# Patient Record
Sex: Female | Born: 1960 | Race: Black or African American | Hispanic: No | Smoking: Never smoker
Health system: Southern US, Community
[De-identification: ages and names within clinical notes are randomized; demographics above are authoritative.]

## PROBLEM LIST (undated history)

## (undated) DIAGNOSIS — E119 Type 2 diabetes mellitus without complications: Secondary | ICD-10-CM

## (undated) DIAGNOSIS — I10 Essential (primary) hypertension: Secondary | ICD-10-CM

## (undated) DIAGNOSIS — I639 Cerebral infarction, unspecified: Secondary | ICD-10-CM

## (undated) DIAGNOSIS — R079 Chest pain, unspecified: Secondary | ICD-10-CM

## (undated) DIAGNOSIS — O2230 Deep phlebothrombosis in pregnancy, unspecified trimester: Secondary | ICD-10-CM

## (undated) DIAGNOSIS — O009 Unspecified ectopic pregnancy without intrauterine pregnancy: Secondary | ICD-10-CM

## (undated) HISTORY — PX: KNEE ARTHROSCOPY: SUR90

## (undated) HISTORY — PX: ABDOMINAL HYSTERECTOMY: SHX81

---

## 2006-10-24 ENCOUNTER — Emergency Department: Payer: Self-pay | Admitting: General Practice

## 2007-04-04 ENCOUNTER — Emergency Department: Payer: Self-pay | Admitting: Emergency Medicine

## 2008-09-22 ENCOUNTER — Emergency Department: Payer: Self-pay | Admitting: Emergency Medicine

## 2009-02-01 ENCOUNTER — Ambulatory Visit: Payer: Self-pay | Admitting: Internal Medicine

## 2009-05-24 ENCOUNTER — Ambulatory Visit: Payer: Self-pay | Admitting: Internal Medicine

## 2010-01-29 ENCOUNTER — Emergency Department: Payer: Self-pay | Admitting: Emergency Medicine

## 2011-05-08 ENCOUNTER — Emergency Department: Payer: Self-pay | Admitting: Emergency Medicine

## 2011-11-24 ENCOUNTER — Emergency Department: Payer: Self-pay | Admitting: Internal Medicine

## 2013-04-22 ENCOUNTER — Emergency Department: Payer: Self-pay | Admitting: Emergency Medicine

## 2013-04-22 LAB — COMPREHENSIVE METABOLIC PANEL
Albumin: 4.1 g/dL (ref 3.4–5.0)
Anion Gap: 11 (ref 7–16)
BUN: 23 mg/dL — ABNORMAL HIGH (ref 7–18)
Bilirubin,Total: 0.3 mg/dL (ref 0.2–1.0)
Calcium, Total: 8.9 mg/dL (ref 8.5–10.1)
Chloride: 104 mmol/L (ref 98–107)
EGFR (African American): >60
Glucose: 129 mg/dL — ABNORMAL HIGH (ref 65–99)
Osmolality: 281 (ref 275–301)
SGOT(AST): 26 U/L (ref 15–37)
Sodium: 138 mmol/L (ref 136–145)

## 2013-04-22 LAB — URINALYSIS, COMPLETE
Blood: NEGATIVE
Ketone: NEGATIVE
Nitrite: NEGATIVE
Ph: 5 (ref 4.5–8.0)
RBC,UR: 1 /HPF (ref 0–5)
Specific Gravity: 1.02 (ref 1.003–1.030)
Squamous Epithelial: 10
WBC UR: 1 /HPF (ref 0–5)

## 2013-04-22 LAB — CBC
HCT: 35.1 % (ref 35.0–47.0)
HGB: 12.1 g/dL (ref 12.0–16.0)
MCHC: 34.4 g/dL (ref 32.0–36.0)
Platelet: 259 10*3/uL (ref 150–440)
RDW: 13.4 % (ref 11.5–14.5)

## 2013-04-22 LAB — TROPONIN I: Troponin-I: <0.02 ng/mL

## 2013-05-15 ENCOUNTER — Ambulatory Visit: Payer: Self-pay | Admitting: Unknown Physician Specialty

## 2013-08-21 ENCOUNTER — Emergency Department: Payer: Self-pay | Admitting: Emergency Medicine

## 2013-08-21 LAB — CBC WITH DIFFERENTIAL/PLATELET
Basophil #: 0.1 10*3/uL (ref 0.0–0.1)
Eosinophil #: 0.2 10*3/uL (ref 0.0–0.7)
HCT: 34 % — ABNORMAL LOW (ref 35.0–47.0)
HGB: 11.6 g/dL — ABNORMAL LOW (ref 12.0–16.0)
Lymphocyte #: 2.1 10*3/uL (ref 1.0–3.6)
Lymphocyte %: 36.7 %
MCH: 27.8 pg (ref 26.0–34.0)
MCV: 81 fL (ref 80–100)
Monocyte #: 0.3 x10 3/mm (ref 0.2–0.9)
Monocyte %: 5.5 %
Neutrophil %: 53.1 %
Platelet: 280 10*3/uL (ref 150–440)
RBC: 4.19 10*6/uL (ref 3.80–5.20)
RDW: 13.7 % (ref 11.5–14.5)

## 2013-08-21 LAB — COMPREHENSIVE METABOLIC PANEL
Alkaline Phosphatase: 148 U/L — ABNORMAL HIGH (ref 50–136)
Anion Gap: 7 (ref 7–16)
BUN: 12 mg/dL (ref 7–18)
Bilirubin,Total: 0.3 mg/dL (ref 0.2–1.0)
Calcium, Total: 9.8 mg/dL (ref 8.5–10.1)
Chloride: 104 mmol/L (ref 98–107)
Co2: 28 mmol/L (ref 21–32)
EGFR (Non-African Amer.): >60
Glucose: 182 mg/dL — ABNORMAL HIGH (ref 65–99)
Potassium: 3.5 mmol/L (ref 3.5–5.1)
SGOT(AST): 24 U/L (ref 15–37)
Total Protein: 8.1 g/dL (ref 6.4–8.2)

## 2013-09-27 ENCOUNTER — Emergency Department: Payer: Self-pay | Admitting: Emergency Medicine

## 2013-09-27 LAB — CBC WITH DIFFERENTIAL/PLATELET
Basophil %: 1.2 %
Eosinophil #: 0.2 10*3/uL (ref 0.0–0.7)
Lymphocyte #: 2.7 10*3/uL (ref 1.0–3.6)
Lymphocyte %: 44.3 %
MCV: 80 fL (ref 80–100)
Monocyte #: 0.5 x10 3/mm (ref 0.2–0.9)
Monocyte %: 8.5 %
Neutrophil #: 2.6 10*3/uL (ref 1.4–6.5)
Platelet: 284 10*3/uL (ref 150–440)
RBC: 4.49 10*6/uL (ref 3.80–5.20)
WBC: 6 10*3/uL (ref 3.6–11.0)

## 2013-09-27 LAB — COMPREHENSIVE METABOLIC PANEL
Albumin: 4.3 g/dL (ref 3.4–5.0)
Alkaline Phosphatase: 158 U/L — ABNORMAL HIGH (ref 50–136)
Anion Gap: 7 (ref 7–16)
Calcium, Total: 10 mg/dL (ref 8.5–10.1)
Chloride: 104 mmol/L (ref 98–107)
Co2: 26 mmol/L (ref 21–32)
Creatinine: 0.68 mg/dL (ref 0.60–1.30)
EGFR (African American): >60
Glucose: 125 mg/dL — ABNORMAL HIGH (ref 65–99)
Potassium: 3.4 mmol/L — ABNORMAL LOW (ref 3.5–5.1)
SGOT(AST): 24 U/L (ref 15–37)
SGPT (ALT): 25 U/L (ref 12–78)
Total Protein: 8.7 g/dL — ABNORMAL HIGH (ref 6.4–8.2)

## 2013-09-27 LAB — URINALYSIS, COMPLETE
Ketone: NEGATIVE
Nitrite: NEGATIVE
Protein: NEGATIVE
Specific Gravity: 1.011 (ref 1.003–1.030)
Squamous Epithelial: 2
WBC UR: NONE SEEN /HPF (ref 0–5)

## 2013-09-27 LAB — APTT: Activated PTT: 32 secs (ref 23.6–35.9)

## 2013-09-27 LAB — RAPID URINE DRUG SCREEN, HOSP PERFORMED
Amphetamines, Ur Screen: NEGATIVE (ref ?–1000)
Barbiturates, Ur Screen: NEGATIVE (ref ?–200)
Benzodiazepine, Ur Scrn: NEGATIVE (ref ?–200)
Cocaine Metabolite,Ur ~~LOC~~: NEGATIVE (ref ?–300)

## 2013-09-27 LAB — PROTIME-INR: Prothrombin Time: 12.5 secs (ref 11.5–14.7)

## 2013-10-04 ENCOUNTER — Emergency Department: Payer: Self-pay | Admitting: Emergency Medicine

## 2013-10-04 LAB — CBC
HGB: 13 g/dL (ref 12.0–16.0)
Platelet: 298 10*3/uL (ref 150–440)
RBC: 4.78 10*6/uL (ref 3.80–5.20)
RDW: 13.4 % (ref 11.5–14.5)
WBC: 14.4 10*3/uL — ABNORMAL HIGH (ref 3.6–11.0)

## 2013-10-04 LAB — LIPASE, BLOOD: Lipase: 121 U/L (ref 73–393)

## 2013-10-04 LAB — COMPREHENSIVE METABOLIC PANEL
Alkaline Phosphatase: 170 U/L — ABNORMAL HIGH (ref 50–136)
Anion Gap: 5 — ABNORMAL LOW (ref 7–16)
BUN: 21 mg/dL — ABNORMAL HIGH (ref 7–18)
Calcium, Total: 10.2 mg/dL — ABNORMAL HIGH (ref 8.5–10.1)
Chloride: 107 mmol/L (ref 98–107)
Co2: 26 mmol/L (ref 21–32)
Creatinine: 0.91 mg/dL (ref 0.60–1.30)
EGFR (Non-African Amer.): >60
Potassium: 3.8 mmol/L (ref 3.5–5.1)
SGOT(AST): 50 U/L — ABNORMAL HIGH (ref 15–37)
SGPT (ALT): 64 U/L (ref 12–78)

## 2013-10-04 LAB — TROPONIN I: Troponin-I: <0.02 ng/mL

## 2015-02-11 ENCOUNTER — Ambulatory Visit: Payer: Self-pay

## 2016-05-06 ENCOUNTER — Ambulatory Visit
Admission: RE | Admit: 2016-05-06 | Discharge: 2016-05-06 | Disposition: A | Discharge status: Home / Self Care | Payer: Self-pay | Source: Ambulatory Visit | Attending: Oncology | Admitting: Oncology

## 2016-05-06 ENCOUNTER — Ambulatory Visit: Payer: Self-pay | Attending: Oncology

## 2016-05-06 ENCOUNTER — Other Ambulatory Visit: Payer: Self-pay | Admitting: Oncology

## 2016-05-06 VITALS — BP 128/70 | HR 81 | Temp 98.1°F | Resp 20 | Ht 64.17 in | Wt 155.9 lb

## 2016-05-06 DIAGNOSIS — Z0000 Encounter for general adult medical examination without abnormal findings: Secondary | ICD-10-CM

## 2016-05-06 NOTE — Progress Notes (Signed)
Subjective:     Patient ID: Mary Cuevas, female   DOB: 1961-11-20, 55 y.o.   MRN: [id number]  HPI   Review of Systems     Objective:   Physical Exam  Pulmonary/Chest: Right breast exhibits no inverted nipple, no mass, no nipple discharge, no skin change and no tenderness. Left breast exhibits no inverted nipple, no mass, no nipple discharge, no skin change and no tenderness. Breasts are symmetrical.       Assessment:     55 year old patient presents for B[facility]CP clinic visit.  Patient screened, and meets BCCCP eligibility.  Patient does not have insurance, Medicare or Medicaid.  Handout given on Affordable Care Act.  Instructed patient on breast self-exam using teach back method.  CBE unremarkable.       Plan:  Sent for bilateral screening mammogram.

## 2016-05-13 NOTE — Progress Notes (Signed)
Letter mailed from [facility] to notify of normal mammogram results.  Patient to return in one year for annual screening.  Copy to [facility]. 

## 2016-10-25 ENCOUNTER — Observation Stay
Admission: EM | Admit: 2016-10-25 | Discharge: 2016-10-26 | Disposition: A | Discharge status: Home / Self Care | Payer: Medicaid Other | Attending: Internal Medicine | Admitting: Internal Medicine

## 2016-10-25 ENCOUNTER — Encounter: Payer: Self-pay | Admitting: Urgent Care

## 2016-10-25 ENCOUNTER — Emergency Department: Payer: Medicaid Other

## 2016-10-25 ENCOUNTER — Observation Stay (HOSPITAL_BASED_OUTPATIENT_CLINIC_OR_DEPARTMENT_OTHER)
Admit: 2016-10-25 | Discharge: 2016-10-25 | Disposition: A | Discharge status: Home / Self Care | Payer: Medicaid Other | Attending: Internal Medicine | Admitting: Internal Medicine

## 2016-10-25 DIAGNOSIS — Z86718 Personal history of other venous thrombosis and embolism: Secondary | ICD-10-CM | POA: Insufficient documentation

## 2016-10-25 DIAGNOSIS — I10 Essential (primary) hypertension: Secondary | ICD-10-CM

## 2016-10-25 DIAGNOSIS — Z7984 Long term (current) use of oral hypoglycemic drugs: Secondary | ICD-10-CM | POA: Insufficient documentation

## 2016-10-25 DIAGNOSIS — E784 Other hyperlipidemia: Secondary | ICD-10-CM

## 2016-10-25 DIAGNOSIS — R0600 Dyspnea, unspecified: Secondary | ICD-10-CM | POA: Diagnosis not present

## 2016-10-25 DIAGNOSIS — Z885 Allergy status to narcotic agent status: Secondary | ICD-10-CM | POA: Insufficient documentation

## 2016-10-25 DIAGNOSIS — E785 Hyperlipidemia, unspecified: Secondary | ICD-10-CM | POA: Insufficient documentation

## 2016-10-25 DIAGNOSIS — I200 Unstable angina: Secondary | ICD-10-CM | POA: Diagnosis present

## 2016-10-25 DIAGNOSIS — Z90710 Acquired absence of both cervix and uterus: Secondary | ICD-10-CM | POA: Diagnosis not present

## 2016-10-25 DIAGNOSIS — Z7982 Long term (current) use of aspirin: Secondary | ICD-10-CM | POA: Insufficient documentation

## 2016-10-25 DIAGNOSIS — R079 Chest pain, unspecified: Secondary | ICD-10-CM

## 2016-10-25 DIAGNOSIS — E119 Type 2 diabetes mellitus without complications: Secondary | ICD-10-CM | POA: Insufficient documentation

## 2016-10-25 DIAGNOSIS — I071 Rheumatic tricuspid insufficiency: Secondary | ICD-10-CM | POA: Insufficient documentation

## 2016-10-25 DIAGNOSIS — Z8673 Personal history of transient ischemic attack (TIA), and cerebral infarction without residual deficits: Secondary | ICD-10-CM | POA: Diagnosis not present

## 2016-10-25 DIAGNOSIS — Z803 Family history of malignant neoplasm of breast: Secondary | ICD-10-CM | POA: Insufficient documentation

## 2016-10-25 DIAGNOSIS — E7849 Other hyperlipidemia: Secondary | ICD-10-CM

## 2016-10-25 HISTORY — DX: Cerebral infarction, unspecified: I63.9

## 2016-10-25 HISTORY — DX: Chest pain, unspecified: R07.9

## 2016-10-25 HISTORY — DX: Type 2 diabetes mellitus without complications: E11.9

## 2016-10-25 HISTORY — DX: Deep phlebothrombosis in pregnancy, unspecified trimester: O22.30

## 2016-10-25 HISTORY — DX: Essential (primary) hypertension: I10

## 2016-10-25 HISTORY — DX: Unspecified ectopic pregnancy without intrauterine pregnancy: O00.90

## 2016-10-25 LAB — LIPID PANEL
CHOL/HDL RATIO: 2.5 ratio
CHOLESTEROL: 130 mg/dL (ref 0–200)
HDL: 51 mg/dL (ref >40)
LDL Cholesterol: 50 mg/dL (ref 0–99)
Triglycerides: 146 mg/dL (ref <150)
VLDL: 29 mg/dL (ref 0–40)

## 2016-10-25 LAB — GLUCOSE, CAPILLARY
GLUCOSE-CAPILLARY: 116 mg/dL — AB (ref 65–99)
GLUCOSE-CAPILLARY: 119 mg/dL — AB (ref 65–99)
GLUCOSE-CAPILLARY: 114 mg/dL — AB (ref 65–99)
Glucose-Capillary: 101 mg/dL — ABNORMAL HIGH (ref 65–99)

## 2016-10-25 LAB — TROPONIN I
Troponin I: <0.03 ng/mL (ref <0.03)
Troponin I: <0.03 ng/mL (ref <0.03)

## 2016-10-25 LAB — BASIC METABOLIC PANEL
ANION GAP: 8 (ref 5–15)
ANION GAP: 8 (ref 5–15)
BUN: 13 mg/dL (ref 6–20)
BUN: 12 mg/dL (ref 6–20)
CHLORIDE: 105 mmol/L (ref 101–111)
CHLORIDE: 107 mmol/L (ref 101–111)
CO2: 27 mmol/L (ref 22–32)
CO2: 27 mmol/L (ref 22–32)
CREATININE: 0.87 mg/dL (ref 0.44–1.00)
Calcium: 9.6 mg/dL (ref 8.9–10.3)
Calcium: 9.5 mg/dL (ref 8.9–10.3)
Creatinine, Ser: 0.94 mg/dL (ref 0.44–1.00)
GFR calc Af Amer: >60 mL/min (ref >60)
GFR calc non Af Amer: >60 mL/min (ref >60)
GFR calc non Af Amer: >60 mL/min (ref >60)
GLUCOSE: 186 mg/dL — AB (ref 65–99)
Glucose, Bld: 180 mg/dL — ABNORMAL HIGH (ref 65–99)
POTASSIUM: 3.6 mmol/L (ref 3.5–5.1)
POTASSIUM: 3.7 mmol/L (ref 3.5–5.1)
SODIUM: 142 mmol/L (ref 135–145)
Sodium: 140 mmol/L (ref 135–145)

## 2016-10-25 LAB — HEPATIC FUNCTION PANEL
ALK PHOS: 95 U/L (ref 38–126)
ALT: 14 U/L (ref 14–54)
AST: 21 U/L (ref 15–41)
Albumin: 4.4 g/dL (ref 3.5–5.0)
Total Bilirubin: 0.3 mg/dL (ref 0.3–1.2)
Total Protein: 8 g/dL (ref 6.5–8.1)

## 2016-10-25 LAB — CBC
HEMATOCRIT: 32.2 % — AB (ref 35.0–47.0)
HEMATOCRIT: 31.8 % — AB (ref 35.0–47.0)
HEMOGLOBIN: 11.1 g/dL — AB (ref 12.0–16.0)
HEMOGLOBIN: 11.1 g/dL — AB (ref 12.0–16.0)
MCH: 27.3 pg (ref 26.0–34.0)
MCH: 27.7 pg (ref 26.0–34.0)
MCHC: 34.4 g/dL (ref 32.0–36.0)
MCHC: 34.9 g/dL (ref 32.0–36.0)
MCV: 79.3 fL — AB (ref 80.0–100.0)
MCV: 79.4 fL — AB (ref 80.0–100.0)
PLATELETS: 254 10*3/uL (ref 150–440)
Platelets: 264 10*3/uL (ref 150–440)
RBC: 4.06 MIL/uL (ref 3.80–5.20)
RBC: 4 MIL/uL (ref 3.80–5.20)
RDW: 13.9 % (ref 11.5–14.5)
RDW: 14.2 % (ref 11.5–14.5)
WBC: 6.4 10*3/uL (ref 3.6–11.0)
WBC: 6.3 10*3/uL (ref 3.6–11.0)

## 2016-10-25 LAB — LIPASE, BLOOD: Lipase: 25 U/L (ref 11–51)

## 2016-10-25 MED ORDER — MORPHINE SULFATE (PF) 4 MG/ML IV SOLN
2.0000 mg | Freq: Once | INTRAVENOUS | Status: AC
  Administered 2016-10-25: 2 mg via INTRAVENOUS

## 2016-10-25 MED ORDER — INSULIN ASPART 100 UNIT/ML ~~LOC~~ SOLN: 0.0000 [IU] | Freq: Three times a day (TID) | SUBCUTANEOUS | Status: DC

## 2016-10-25 MED ORDER — ROSUVASTATIN CALCIUM 20 MG PO TABS
40.0000 mg | ORAL_TABLET | Freq: Every day | ORAL | Status: DC
Start: 2016-10-25 — End: 2016-10-26
  Administered 2016-10-25 – 2016-10-26 (×2): 40 mg via ORAL
  Filled 2016-10-25 (×2): qty 2

## 2016-10-25 MED ORDER — ONDANSETRON HCL 4 MG/2ML IJ SOLN
INTRAMUSCULAR | Status: AC
  Filled 2016-10-25: qty 2

## 2016-10-25 MED ORDER — SODIUM CHLORIDE 0.9% FLUSH
3.0000 mL | Freq: Two times a day (BID) | INTRAVENOUS | Status: DC
  Administered 2016-10-25 (×2): 3 mL via INTRAVENOUS

## 2016-10-25 MED ORDER — LISINOPRIL 20 MG PO TABS
40.0000 mg | ORAL_TABLET | Freq: Every day | ORAL | Status: DC
Start: 2016-10-25 — End: 2016-10-26
  Administered 2016-10-25 – 2016-10-26 (×2): 40 mg via ORAL
  Filled 2016-10-25 (×2): qty 2

## 2016-10-25 MED ORDER — TRAMADOL HCL 50 MG PO TABS
50.0000 mg | ORAL_TABLET | Freq: Four times a day (QID) | ORAL | Status: DC | PRN
  Administered 2016-10-25: 50 mg via ORAL
  Filled 2016-10-25 (×2): qty 1

## 2016-10-25 MED ORDER — HYDROCHLOROTHIAZIDE 25 MG PO TABS
25.0000 mg | ORAL_TABLET | Freq: Every day | ORAL | Status: DC
  Administered 2016-10-25 – 2016-10-26 (×2): 25 mg via ORAL
  Filled 2016-10-25 (×2): qty 1

## 2016-10-25 MED ORDER — DULOXETINE HCL 30 MG PO CPEP
30.0000 mg | ORAL_CAPSULE | Freq: Every day | ORAL | Status: DC
  Filled 2016-10-25 (×2): qty 1

## 2016-10-25 MED ORDER — NITROGLYCERIN 2 % TD OINT
1.0000 [in_us] | TOPICAL_OINTMENT | Freq: Once | TRANSDERMAL | Status: AC
  Administered 2016-10-25: 1 [in_us] via TOPICAL
  Filled 2016-10-25: qty 1

## 2016-10-25 MED ORDER — GABAPENTIN 300 MG PO CAPS
300.0000 mg | ORAL_CAPSULE | Freq: Three times a day (TID) | ORAL | Status: DC
Start: 2016-10-25 — End: 2016-10-26
  Administered 2016-10-25 – 2016-10-26 (×4): 300 mg via ORAL
  Filled 2016-10-25 (×4): qty 1

## 2016-10-25 MED ORDER — ACETAMINOPHEN 325 MG PO TABS: 650.0000 mg | ORAL_TABLET | ORAL | Status: DC | PRN

## 2016-10-25 MED ORDER — ONDANSETRON HCL 4 MG/2ML IJ SOLN
4.0000 mg | Freq: Four times a day (QID) | INTRAMUSCULAR | Status: DC | PRN
  Administered 2016-10-25: 4 mg via INTRAVENOUS
  Filled 2016-10-25: qty 2

## 2016-10-25 MED ORDER — ASPIRIN EC 81 MG PO TBEC
81.0000 mg | DELAYED_RELEASE_TABLET | Freq: Every day | ORAL | Status: DC
  Administered 2016-10-26: 81 mg via ORAL
  Filled 2016-10-25: qty 1

## 2016-10-25 MED ORDER — NITROGLYCERIN 0.4 MG SL SUBL: 0.4000 mg | SUBLINGUAL_TABLET | SUBLINGUAL | Status: DC | PRN

## 2016-10-25 MED ORDER — CARVEDILOL 6.25 MG PO TABS
12.5000 mg | ORAL_TABLET | Freq: Two times a day (BID) | ORAL | Status: DC
  Administered 2016-10-25 – 2016-10-26 (×3): 12.5 mg via ORAL
  Filled 2016-10-25 (×3): qty 2

## 2016-10-25 MED ORDER — SODIUM CHLORIDE 0.9 % IV SOLN: 250.0000 mL | INTRAVENOUS | Status: DC | PRN

## 2016-10-25 MED ORDER — GI COCKTAIL ~~LOC~~
30.0000 mL | Freq: Three times a day (TID) | ORAL | Status: DC | PRN
  Administered 2016-10-25 (×2): 30 mL via ORAL
  Filled 2016-10-25 (×2): qty 30

## 2016-10-25 MED ORDER — GLIPIZIDE ER 10 MG PO TB24
10.0000 mg | ORAL_TABLET | Freq: Every day | ORAL | Status: DC
  Administered 2016-10-25 – 2016-10-26 (×2): 10 mg via ORAL
  Filled 2016-10-25 (×2): qty 1

## 2016-10-25 MED ORDER — DULOXETINE HCL 30 MG PO CPEP
30.0000 mg | ORAL_CAPSULE | Freq: Every day | ORAL | Status: DC
  Administered 2016-10-25: 30 mg via ORAL
  Filled 2016-10-25: qty 1

## 2016-10-25 MED ORDER — ONDANSETRON HCL 4 MG/2ML IJ SOLN
4.0000 mg | Freq: Once | INTRAMUSCULAR | Status: AC
  Administered 2016-10-25: 4 mg via INTRAVENOUS

## 2016-10-25 MED ORDER — HYDROXYCHLOROQUINE SULFATE 200 MG PO TABS
200.0000 mg | ORAL_TABLET | Freq: Two times a day (BID) | ORAL | Status: DC
Start: 2016-10-25 — End: 2016-10-26
  Administered 2016-10-25 – 2016-10-26 (×3): 200 mg via ORAL
  Filled 2016-10-25 (×3): qty 1

## 2016-10-25 MED ORDER — INSULIN ASPART 100 UNIT/ML ~~LOC~~ SOLN: 0.0000 [IU] | Freq: Every day | SUBCUTANEOUS | Status: DC

## 2016-10-25 MED ORDER — SODIUM CHLORIDE 0.9% FLUSH: 3.0000 mL | INTRAVENOUS | Status: DC | PRN

## 2016-10-25 MED ORDER — ASPIRIN EC 81 MG PO TBEC: 81.0000 mg | DELAYED_RELEASE_TABLET | Freq: Every day | ORAL | Status: DC

## 2016-10-25 MED ORDER — BIOTIN 1000 MCG PO TABS: 1000.0000 ug | ORAL_TABLET | Freq: Every day | ORAL | Status: DC

## 2016-10-25 MED ORDER — PANTOPRAZOLE SODIUM 40 MG PO TBEC
40.0000 mg | DELAYED_RELEASE_TABLET | Freq: Every day | ORAL | Status: DC
  Administered 2016-10-25 – 2016-10-26 (×2): 40 mg via ORAL
  Filled 2016-10-25 (×2): qty 1

## 2016-10-25 MED ORDER — ENOXAPARIN SODIUM 40 MG/0.4ML ~~LOC~~ SOLN
40.0000 mg | Freq: Every day | SUBCUTANEOUS | Status: DC
  Administered 2016-10-25 – 2016-10-26 (×2): 40 mg via SUBCUTANEOUS
  Filled 2016-10-25 (×2): qty 0.4

## 2016-10-25 MED ORDER — MORPHINE SULFATE (PF) 2 MG/ML IV SOLN
INTRAVENOUS | Status: AC
  Filled 2016-10-25: qty 1

## 2016-10-25 NOTE — Progress Notes (Signed)
[employer] - [place] at [facility]   PATIENT NAME: Mary Cuevas    MR#:  [id number]  DATE OF BIRTH:  06-28-1961  SUBJECTIVE:   here with DOE and chest pain  REVIEW OF SYSTEMS:    Review of Systems  Constitutional: Negative.  Negative for chills, fever and malaise/fatigue.  HENT: Negative.  Negative for ear discharge, ear pain, hearing loss, nosebleeds and sore throat.   Eyes: Negative.  Negative for blurred vision and pain.  Respiratory: Positive for shortness of breath. Negative for cough, hemoptysis and wheezing.   Cardiovascular: Positive for chest pain, orthopnea, leg swelling and PND. Negative for palpitations.  Gastrointestinal: Negative.  Negative for abdominal pain, blood in stool, diarrhea, nausea and vomiting.  Genitourinary: Negative.  Negative for dysuria.  Musculoskeletal: Negative.  Negative for back pain.  Skin: Negative.   Neurological: Negative for dizziness, tremors, speech change, focal weakness, seizures and headaches.  Endo/Heme/Allergies: Negative.  Does not bruise/bleed easily.  Psychiatric/Behavioral: Negative.  Negative for depression, hallucinations and suicidal ideas.    Tolerating Diet: yes      DRUG ALLERGIES:   Allergies  Allergen Reactions  . Demerol [Meperidine] Nausea And Vomiting  . Dilaudid [Hydromorphone Hcl] Nausea And Vomiting and Other (See Comments)    " made me feel like I was crazy" Requests not to take this medication again.  . Vicodin [Hydrocodone-Acetaminophen] Nausea And Vomiting    VITALS:  Blood pressure 113/67, pulse 84, temperature 98 F (36.7 C), temperature source Oral, resp. rate 16, height 5\' 4"  (1.626 m), weight 68 kg (150 lb), SpO2 97 %.  PHYSICAL EXAMINATION:   Physical Exam  Constitutional: She is oriented to person, place, and time and well-developed, well-nourished, and in no distress. No distress.  HENT:  Head: Normocephalic.  Eyes: No scleral icterus.  Neck: Normal range of  motion. Neck supple. No JVD present. No tracheal deviation present.  Cardiovascular: Normal rate, regular rhythm and normal heart sounds.  Exam reveals no gallop and no friction rub.   No murmur heard. Pulmonary/Chest: Effort normal and breath sounds normal. No respiratory distress. She has no wheezes. She has no rales. She exhibits no tenderness.  Abdominal: Soft. Bowel sounds are normal. She exhibits no distension and no mass. There is no tenderness. There is no rebound and no guarding.  Musculoskeletal: Normal range of motion. She exhibits edema.  Neurological: She is alert and oriented to person, place, and time.  Skin: Skin is warm. No rash noted. No erythema.  Psychiatric: Affect and judgment normal.      LABORATORY PANEL:   CBC  Recent Labs Lab 10/25/16 0555  WBC 6.3  HGB 11.1*  HCT 31.8*  PLT 254   ------------------------------------------------------------------------------------------------------------------  Chemistries   Recent Labs Lab 10/25/16 0334 10/25/16 0555  NA 140 142  K 3.6 3.7  CL 105 107  CO2 27 27  GLUCOSE 186* 180*  BUN 13 12  CREATININE 0.94 0.87  CALCIUM 9.6 9.5  AST 21  --   ALT 14  --   ALKPHOS 95  --   BILITOT 0.3  --    ------------------------------------------------------------------------------------------------------------------  Cardiac Enzymes  Recent Labs Lab 10/25/16 0334 10/25/16 0555  TROPONINI <0.03 <0.03   ------------------------------------------------------------------------------------------------------------------  RADIOLOGY:  Dg Chest 2 View  Result Date: 10/25/2016 CLINICAL DATA:  Chest pain, shortness of breath, and abdominal pain. History of hypertension and diabetes. EXAM: CHEST  2 VIEW COMPARISON:  04/22/2013 FINDINGS: Shallow inspiration. Normal heart size and pulmonary vascularity. No focal airspace  disease or consolidation in the lungs. No blunting of costophrenic angles. No pneumothorax.  Mediastinal contours appear intact. IMPRESSION: No active cardiopulmonary disease. Electronically Signed   By: Burman NievesWilliam  Stevens M.D.   On: 10/25/2016 04:10     ASSESSMENT AND PLAN:   55 y/o female with PMHx of diabetes and essential hypertension who presents with dyspnea on exertion and chest pain.   1. DOE: Patient will need likely need full cardiac workup with ECHO and stress test. Cardiology evaluation pending. troponins negative for ACS.  2. Chest pain: She has been ruled out for ACS but will need ECHO and stress test. Continue ASA.  3. Essential HTN: continue HCTZ, lisinopril and Coreg  4. Diabetes: Continue sliding scale insulin and glipizide  5. Hyperlipidemia on Crestor     Management plans discussed with the patient and she  is in agreement.  CODE STATUS: full  TOTAL TIME TAKING CARE OF THIS PATIENT: 30 minutes.  Rounded with nursing staff   POSSIBLE D/C tomorrow, DEPENDING ON CLINICAL CONDITION.   Beatris Belen M.D on 10/25/2016 at 8:15 AM  Between 7am to 6pm - Pager - [phone] After 6pm go to [email or web address] - password [email or web address]  [facility]  Office  [phone]  CC: Primary care physician; Rory PercyUHL, LAURA J, MD  Note: This dictation was prepared with Dragon dictation along with smaller phrase technology. Any transcriptional errors that result from this process are unintentional.

## 2016-10-25 NOTE — Progress Notes (Signed)
PHARMACIST - PHYSICIAN ORDER COMMUNICATION  CONCERNING: P&T Medication Policy on Herbal Medications  DESCRIPTION:  This patient's order for:  biotin  has been noted.  This product(s) is classified as an "herbal" or natural product. Due to a lack of definitive safety studies or FDA approval, nonstandard manufacturing practices, plus the potential risk of unknown drug-drug interactions while on inpatient medications, the Pharmacy and Therapeutics Committee does not permit the use of "herbal" or natural products of this type within [place].   ACTION TAKEN: The pharmacy department is unable to verify this order at this time Please reevaluate patient's clinical condition at discharge and address if the herbal or natural product(s) should be resumed at that time.   

## 2016-10-25 NOTE — ED Triage Notes (Signed)
Patient presents to the ED via EMS from a "girl's pajama party". Patient with c/o pain under her LEFT breast that began with acute onset tonight. Patient self administered ASA 324mg  and SL NTG x 1; noted some improvement. EMS arrived; EKG unremarkable. Second dose of NTG given by EMS; pain improved and is currently at a 5/10. Patient also reports peri-umbilical pain that began en route to the ED. CBG 207. PMH significant for T2DM and HTN.

## 2016-10-25 NOTE — Progress Notes (Signed)
CH responded to an OR for an AD. Pt was alert. Family was bedside. [facility] and [facility]. Pt and Family will read through the material and fill it out at their discression. CH is available for follow up as needed.    10/25/16 1000  Clinical Encounter Type  Visited With Patient;Patient and family together  Visit Type Initial;Spiritual support;Other (Comment) (AD)  Referral From Nurse  Spiritual Encounters  Spiritual Needs Literature

## 2016-10-25 NOTE — ED Provider Notes (Signed)
[facility] Emergency Department Provider Note   ____________________________________________   First MD Initiated Contact with Patient 10/25/16 0345     (approximate)  I have reviewed the triage vital signs and the nursing notes.   HISTORY  Chief Complaint Chest Pain; Shortness of Breath; and Abdominal Pain    HPI Mary Cuevas is a 55 y.o. female who presents to the ED from home via EMS with a chief complaint of chest pain. Patient has a history of diabetes, hypertension, CVA, remote history of DVT in pregnancy who has been experiencing waxing/waning chest pain over the past several days. Symptoms are associated with dyspnea on exertion MI: No associated diaphoresis, nausea/vomiting or dizziness. Complains of left-sided chest pressure tonight which occasionally radiates into her left neck. Patient took full-strength aspirin and 1 sublingual nitroglycerin prior to EMS arrival. Chest pain is partially improved and is currently 3/10. Denies recent fever, chills, abdominal pain, dysuria, diarrhea. Denies recent travel or trauma. Denies hormone use.   Past Medical History:  Diagnosis Date  . Chest pain   . Diabetes mellitus without complication (HCC)   . DVT (deep vein thrombosis) in pregnancy (HCC)   . Ectopic pregnancy   . Hypertension   . Stroke (cerebrum) (HCC)     There are no active problems to display for this patient.   Past Surgical History:  Procedure Laterality Date  . ABDOMINAL HYSTERECTOMY    . KNEE ARTHROSCOPY      Prior to Admission medications   Not on File    Allergies Demerol [meperidine] and Vicodin [hydrocodone-acetaminophen]  Family History  Problem Relation Age of Onset  . Breast cancer Maternal Aunt     50's  . Breast cancer Cousin     6550's    Social History Social History  Substance Use Topics  . Smoking status: Never Smoker  . Smokeless tobacco: Never Used  . Alcohol use No    Review of  Systems  Constitutional: No fever/chills. Eyes: No visual changes. ENT: No sore throat. Cardiovascular: Positive for chest pain. Respiratory: Positive for dyspnea on exertion. Gastrointestinal: No abdominal pain.  No nausea, no vomiting.  No diarrhea.  No constipation. Genitourinary: Negative for dysuria. Musculoskeletal: Negative for back pain. Skin: Negative for rash. Neurological: Negative for headaches, focal weakness or numbness.  10-point ROS otherwise negative.  ____________________________________________   PHYSICAL EXAM:  VITAL SIGNS: ED Triage Vitals  Enc Vitals Group     BP 10/25/16 0332 (!) 135/91     Pulse Rate 10/25/16 0332 91     Resp 10/25/16 0332 20     Temp 10/25/16 0332 98.1 F (36.7 C)     Temp Source 10/25/16 0332 Oral     SpO2 10/25/16 0332 100 %     Weight 10/25/16 0333 150 lb (68 kg)     Height 10/25/16 0333 5\' 4"  (1.626 m)     Head Circumference --      Peak Flow --      Pain Score 10/25/16 0333 5     Pain Loc --      Pain Edu? --      Excl. in GC? --     Constitutional: Alert and oriented. Well appearing and in no acute distress. Eyes: Conjunctivae are normal. PERRL. EOMI. Head: Atraumatic. Nose: No congestion/rhinnorhea. Mouth/Throat: Mucous membranes are moist.  Oropharynx non-erythematous. Neck: No stridor.   Cardiovascular: Normal rate, regular rhythm. Grossly normal heart sounds.  Good peripheral circulation. Respiratory: Normal respiratory effort.  No retractions. Lungs CTAB. Gastrointestinal: Soft and nontender. No distention. No abdominal bruits. No CVA tenderness. Musculoskeletal: No lower extremity tenderness nor edema.  No joint effusions. Neurologic:  Normal speech and language. No gross focal neurologic deficits are appreciated.  Skin:  Skin is warm, dry and intact. No rash noted. Psychiatric: Mood and affect are normal. Speech and behavior are normal.  ____________________________________________   LABS (all labs ordered  are listed, but only abnormal results are displayed)  Labs Reviewed  BASIC METABOLIC PANEL - Abnormal; Notable for the following:       Result Value   Glucose, Bld 186 (*)    All other components within normal limits  CBC - Abnormal; Notable for the following:    Hemoglobin 11.1 (*)    HCT 32.2 (*)    MCV 79.3 (*)    All other components within normal limits  TROPONIN I   ____________________________________________  EKG  ED ECG REPORT I, Mohini Heathcock J, the attending physician, personally viewed and interpreted this ECG.   Date: 10/25/2016  EKG Time: 0328  Rate: 95  Rhythm: normal EKG, normal sinus rhythm  Axis: Normal  Intervals:none  ST&T Change: T-wave inversion anterior leads which is new compared to prior EKG from 2014  ____________________________________________  RADIOLOGY  Chest 2 view (viewed by me, interpreted per Dr. Andria MeuseStevens): No active cardiopulmonary disease. ____________________________________________   PROCEDURES  Procedure(s) performed: None  Procedures  Critical Care performed: No  ____________________________________________   INITIAL IMPRESSION / ASSESSMENT AND PLAN / ED COURSE  Pertinent labs & imaging results that were available during my care of the patient were reviewed by me and considered in my medical decision making (see chart for details).  55 year old female who presents with symptoms concerning for unstable angina. Initial EKG and troponin are unremarkable. Will administer Nitropaste; discussed with hospitalists to evaluate patient in the emergency department for admission.  Clinical Course      ____________________________________________   FINAL CLINICAL IMPRESSION(S) / ED DIAGNOSES  Final diagnoses:  Chest pain, unspecified type  Unstable angina (HCC)      NEW MEDICATIONS STARTED DURING THIS VISIT:  New Prescriptions   No medications on file     Note:  This document was prepared using Dragon voice  recognition software and may include unintentional dictation errors.    Irean HongJade J Xavian Hardcastle, MD 10/25/16 [phone]

## 2016-10-25 NOTE — ED Notes (Signed)
Pt states has  Had intermittent left sided chest pain with radiation to left lateral neck off and on "for a while". Pt states she is able to take a nitroglycerin at home and pain abates. Pt states "i'm winded just walking across the room". Pt states pain is sometimes associated with nausea. Pt states "it hurts so much I can't even move." skin normal color warm and dry. resps unlabored. Pt also complains of left upper and lower quadrant pain.

## 2016-10-25 NOTE — Progress Notes (Signed)
Patient refused to take her Cymbalta this morning stated that she likes to take it at night because it makes her sleepy. Attending made aware.

## 2016-10-25 NOTE — ED Notes (Signed)
Pt states chest pain 3/10 and abdominal pain 5/10.

## 2016-10-25 NOTE — ED Notes (Signed)
hospitalist in to see pt.

## 2016-10-25 NOTE — ED Notes (Signed)
Pt denies needs at this time. Family at bedside.  

## 2016-10-25 NOTE — Consult Note (Signed)
Cardiology Consultation   Patient ID: Mary HippoKatrina F Tolen; [id number]; 01/27/1961   Admit date: 10/25/2016 Date of Consult: 10/25/2016  Referring MD:  Dr. Juliene PinaMody  Patient Care Team: Rory PercyLaura J Ruhl, MD as PCP - General (Preventative Medicine) Jim LikeSheena M Lambert, RN as Registered Nurse Scarlett PrestoAnne F Shaver, RN as Registered Nurse    Reason for Consultation: CP   History of Present Illness: Mary Cuevas is a 55 y.o. female with a hx of hypertension, hyperlipidemia, diabetes, CVA  She presented to the ER yesterday after onset of chest pain. She was still awake after midnight. Sudden onset of chest pain 10 out of 10 severity, nonradiating. She took 4 baby aspirins and nitroglycerin sublingual. A cousin called 911 and she was brought to the ER. She thinks the chest pain lasted for a little over an hour. Currently no chest pain.   Patient reports that she's been having shortness of breath for more than a year now. Dyspnea with minimal exertion. Worsened over time. [facility] for similar complaints. Doubt that her symptoms are worse now.  Patient denies PND, orthopnea, edema, palpitations, syncope   ROS:  Please see the history of present illness.  ROS All other ROS reviewed and negative.    Past Medical History:  Diagnosis Date  . Chest pain   . Diabetes mellitus without complication (HCC)   . DVT (deep vein thrombosis) in pregnancy (HCC)   . Ectopic pregnancy   . Hypertension   . Stroke (cerebrum) [facility](HCC)     Past Surgical History:  Procedure Laterality Date  . ABDOMINAL HYSTERECTOMY    . KNEE ARTHROSCOPY        Home Meds: Prior to Admission medications   Medication Sig Start Date End Date Taking? Authorizing Provider  amLODipine (NORVASC) 10 MG tablet Take 10 mg by mouth daily.   Yes Historical Provider, MD  aspirin EC 81 MG tablet Take 81 mg by mouth daily.   Yes Historical Provider, MD  Biotin 1000 MCG tablet Take 1,000 mcg by mouth daily.   Yes  Historical Provider, MD  carvedilol (COREG) 6.25 MG tablet Take 12.5 mg by mouth 2 (two) times daily with a meal.   Yes Historical Provider, MD  clobetasol cream (TEMOVATE) 0.05 % Apply 1 application topically 2 (two) times daily.   Yes Historical Provider, MD  DULoxetine (CYMBALTA) 30 MG capsule Take 30 mg by mouth daily.   Yes Historical Provider, MD  gabapentin (NEURONTIN) 300 MG capsule Take 300 mg by mouth 3 (three) times daily.   Yes Historical Provider, MD  glipiZIDE (GLUCOTROL XL) 10 MG 24 hr tablet Take 10 mg by mouth daily with breakfast.   Yes Historical Provider, MD  hydrochlorothiazide (HYDRODIURIL) 25 MG tablet Take 25 mg by mouth daily.   Yes Historical Provider, MD  hydroxychloroquine (PLAQUENIL) 200 MG tablet Take 200 mg by mouth 2 (two) times daily.   Yes Historical Provider, MD  lisinopril (PRINIVIL,ZESTRIL) 40 MG tablet Take 40 mg by mouth daily.   Yes Historical Provider, MD  nitroGLYCERIN (NITROSTAT) 0.4 MG SL tablet Place 0.4 mg under the tongue every 5 (five) minutes as needed for chest pain.   Yes Historical Provider, MD  omeprazole (PRILOSEC) 20 MG capsule Take 20 mg by mouth daily.   Yes Historical Provider, MD  rosuvastatin (CRESTOR) 40 MG tablet Take 40 mg by mouth daily.   Yes Historical Provider, MD  triamcinolone cream (KENALOG) 0.1 % Apply 1 application topically 2 (two)  times daily.   Yes Historical Provider, MD    Current Medications: . [START ON 10/26/2016] aspirin EC  81 mg Oral Daily  . carvedilol  12.5 mg Oral BID WC  . DULoxetine  30 mg Oral Daily  . enoxaparin (LOVENOX) injection  40 mg Subcutaneous Daily  . gabapentin  300 mg Oral TID  . glipiZIDE  10 mg Oral Q breakfast  . hydrochlorothiazide  25 mg Oral Daily  . hydroxychloroquine  200 mg Oral BID  . insulin aspart  0-5 Units Subcutaneous QHS  . insulin aspart  0-9 Units Subcutaneous TID WC  . lisinopril  40 mg Oral Daily  . morphine      . pantoprazole  40 mg Oral QAC breakfast  . rosuvastatin   40 mg Oral Daily  . sodium chloride flush  3 mL Intravenous Q12H     Allergies:    Allergies  Allergen Reactions  . Demerol [Meperidine] Nausea And Vomiting  . Dilaudid [Hydromorphone Hcl] Nausea And Vomiting and Other (See Comments)    " made me feel like I was crazy" Requests not to take this medication again.  . Vicodin [Hydrocodone-Acetaminophen] Nausea And Vomiting    Social History:   The patient  reports that she has never smoked. She has never used smokeless tobacco. She reports that she does not drink alcohol or use drugs.    Family History:   The patient's family history includes Breast cancer in her cousin and maternal aunt.     No known CAD or SCD in the family  PHYSICAL EXAM: VS:  BP 103/63 (BP Location: Left Arm)   Pulse 82   Temp 98.5 F (36.9 C) (Oral)   Resp 16   Ht 5\' 4"  (1.626 m)   Wt 150 lb (68 kg)   SpO2 95%   BMI 25.75 kg/m  , BMI Body mass index is 25.75 kg/m. GENERAL:  well developed, well nourished, not in acute distress HEENT: normocephalic, pink conjunctivae, anicteric sclerae, no xanthelasma, normal dentition, oropharynx clear NECK:  no neck vein engorgement, JVP normal, no hepatojugular reflux, carotid upstroke brisk and symmetric, no bruit, no thyromegaly, no lymphadenopathy LUNGS:  good respiratory effort, clear to auscultation bilaterally CV:  PMI not displaced, no thrills, no lifts, S1 and S2 within normal limits, no palpable S3 or S4, no murmurs, no rubs, no gallops ABD:  Soft, nontender, nondistended, normoactive bowel sounds, no abdominal aortic bruit, no hepatomegaly, no splenomegaly MS: nontender back, no kyphosis, no scoliosis, no joint deformities EXT:  2+ DP/PT pulses, no edema, no varicosities, no cyanosis, no clubbing SKIN: warm, nondiaphoretic, normal turgor, no ulcers NEUROPSYCH: alert, oriented to person, place, and time, sensory/motor grossly intact, normal mood, appropriate affect    EKG:  EKG from 10/25/2016 3:28 AM was  personally reviewed by me. This showed sinus rhythm, 95 BPM, nonspecific ST-T wave changes. No significant change from EKG 10/04/2013.  Labs:  Recent Labs  10/25/16 0334 10/25/16 0555  TROPONINI <0.03 <0.03   Lab Results  Component Value Date   WBC 6.3 10/25/2016   HGB 11.1 (L) 10/25/2016   HCT 31.8 (L) 10/25/2016   MCV 79.4 (L) 10/25/2016   PLT 254 10/25/2016    Recent Labs Lab 10/25/16 0334 10/25/16 0555  NA 140 142  K 3.6 3.7  CL 105 107  CO2 27 27  BUN 13 12  CREATININE 0.94 0.87  CALCIUM 9.6 9.5  PROT 8.0  --   BILITOT 0.3  --   [facility]  95  --   ALT 14  --   AST 21  --   GLUCOSE 186* 180*   Lab Results  Component Value Date   CHOL 130 10/25/2016   HDL 51 10/25/2016   LDLCALC 50 10/25/2016   TRIG 146 10/25/2016   No results found for: DDIMER  Radiology/Studies:  Dg Chest 2 View  Result Date: 10/25/2016 CLINICAL DATA:  Chest pain, shortness of breath, and abdominal pain. History of hypertension and diabetes. EXAM: CHEST  2 VIEW COMPARISON:  04/22/2013 FINDINGS: Shallow inspiration. Normal heart size and pulmonary vascularity. No focal airspace disease or consolidation in the lungs. No blunting of costophrenic angles. No pneumothorax. Mediastinal contours appear intact. IMPRESSION: No active cardiopulmonary disease. Electronically Signed   By: Burman Nieves M.D.   On: 10/25/2016 04:10   [facility] Echo 10/09/2014: Left ventricularhypertrophy Normalleft ventricularejection fraction(60-65%) Diastolicleft ventriculardysfunction Normalright ventricularcontractile performance  [facility] PET myocardial perfusion 10/15/2014:  Nuclear Perfusion Findings: Comparison of the stress and rest imaging revealed homogeneous  radioisotope tracer uptake with no stress-induced perfusion abnormalities. Nuclear Wall Motion Findings: During stress: Global systolic function is normal. The ejection fraction  was greater than 65%.  PROBLEM  LIST:  Principal Problem:   Unstable angina (HCC) Active Problems:   Chest pain     ASSESSMENT AND PLAN:  Chest pain, SOB troponin 2 negative.  no recurrence of chest pain  nonspecific ST-T wave changes on EKG, unchanged from previous Significant risk factors for CAD include diabetes, hypertension, hyperlipidemia Recommend further evaluation with exercise nuclear stress test. Continue medical therapy including aspirin, statin therapy, beta blocker  Hypertension BP is well controlled. Continue monitoring BP. Continue current medical therapy and lifestyle changes.  Hyperlipidemia LDL goal is less than 70 due to diabetes. Continue statin therapy.  I spent at least 80 minutes with the patient today and more than 50% of the time was spent counseling the patient and coordinating care.   Signed, Almond Lint, MD  10/25/2016 11:39 AM  [facility]

## 2016-10-25 NOTE — H&P (Signed)
[facility] [place] at [facility]   PATIENT NAME: Mary Cuevas Cremeans    MR#:  [id number]  DATE OF BIRTH:  [birth year]  DATE OF ADMISSION:  10/25/2016  PRIMARY CARE PHYSICIAN: Rory PercyUHL, LAURA J, MD   REQUESTING/REFERRING PHYSICIAN:   CHIEF COMPLAINT:   Chief Complaint  Patient presents with  . Chest Pain  . Shortness of Breath  . Abdominal Pain    HISTORY OF PRESENT ILLNESS: Mary Cuevas Fails  is a 55 y.o. female with a known history of Type 2 diabetes mellitus, hypertension, CVA, hyperlipidemia presented to the emergency room with chest pain. Patient has chest pain on and off for the last couple of months. It's located in the left side of the chest and sharp in nature whenever pain comes it is 10 out of 10 on a scale of 1-10. Patient also complains of epigastric pain and discomfort. No history of nausea or vomiting. No complaints of shortness of breath, orthopnea and palpitations. Patient was worked up in the emergency room first set of troponin was negative EKG showed T inversions in anterior leads. Patient to oral aspirin and sublingual nitroglycerin prior to arrival to the emergency room. Hospitalist service was consulted for further care of the patient.  PAST MEDICAL HISTORY:   Past Medical History:  Diagnosis Date  . Chest pain   . Diabetes mellitus without complication (HCC)   . DVT (deep vein thrombosis) in pregnancy (HCC)   . Ectopic pregnancy   . Hypertension   . Stroke (cerebrum) (HCC)     PAST SURGICAL HISTORY: Past Surgical History:  Procedure Laterality Date  . ABDOMINAL HYSTERECTOMY    . KNEE ARTHROSCOPY      SOCIAL HISTORY:  Social History  Substance Use Topics  . Smoking status: Never Smoker  . Smokeless tobacco: Never Used  . Alcohol use No    FAMILY HISTORY:  Family History  Problem Relation Age of Onset  . Breast cancer Maternal Aunt     50's  . Breast cancer Cousin     50's    DRUG ALLERGIES:  Allergies  Allergen  Reactions  . Demerol [Meperidine] Nausea And Vomiting  . Dilaudid [Hydromorphone Hcl] Nausea And Vomiting and Other (See Comments)    " made me feel like I was crazy" Requests not to take this medication again.  . Vicodin [Hydrocodone-Acetaminophen] Nausea And Vomiting    REVIEW OF SYSTEMS:   CONSTITUTIONAL: No fever, fatigue or weakness.  EYES: No blurred or double vision.  EARS, NOSE, AND THROAT: No tinnitus or ear pain.  RESPIRATORY: No cough, shortness of breath, wheezing or hemoptysis.  CARDIOVASCULAR: Has chest pain, no orthopnea, edema.  GASTROINTESTINAL: No nausea, vomiting, diarrhea   Has epigastric pain.  GENITOURINARY: No dysuria, hematuria.  ENDOCRINE: No polyuria, nocturia,  HEMATOLOGY: No anemia, easy bruising or bleeding SKIN: No rash or lesion. MUSCULOSKELETAL: No joint pain or arthritis.   NEUROLOGIC: No tingling, numbness, weakness.  PSYCHIATRY: No anxiety or depression.   MEDICATIONS AT HOME:  Prior to Admission medications   Medication Sig Start Date End Date Taking? Authorizing Provider  amLODipine (NORVASC) 10 MG tablet Take 10 mg by mouth daily.   Yes Historical Provider, MD  aspirin EC 81 MG tablet Take 81 mg by mouth daily.   Yes Historical Provider, MD  Biotin 1000 MCG tablet Take 1,000 mcg by mouth daily.   Yes Historical Provider, MD  carvedilol (COREG) 6.25 MG tablet Take 12.5 mg by mouth 2 (two) times daily with a  meal.   Yes Historical Provider, MD  clobetasol cream (TEMOVATE) 0.05 % Apply 1 application topically 2 (two) times daily.   Yes Historical Provider, MD  DULoxetine (CYMBALTA) 30 MG capsule Take 30 mg by mouth daily.   Yes Historical Provider, MD  gabapentin (NEURONTIN) 300 MG capsule Take 300 mg by mouth 3 (three) times daily.   Yes Historical Provider, MD  glipiZIDE (GLUCOTROL XL) 10 MG 24 hr tablet Take 10 mg by mouth daily with breakfast.   Yes Historical Provider, MD  hydrochlorothiazide (HYDRODIURIL) 25 MG tablet Take 25 mg by mouth  daily.   Yes Historical Provider, MD  hydroxychloroquine (PLAQUENIL) 200 MG tablet Take 200 mg by mouth 2 (two) times daily.   Yes Historical Provider, MD  lisinopril (PRINIVIL,ZESTRIL) 40 MG tablet Take 40 mg by mouth daily.   Yes Historical Provider, MD  nitroGLYCERIN (NITROSTAT) 0.4 MG SL tablet Place 0.4 mg under the tongue every 5 (five) minutes as needed for chest pain.   Yes Historical Provider, MD  omeprazole (PRILOSEC) 20 MG capsule Take 20 mg by mouth daily.   Yes Historical Provider, MD  rosuvastatin (CRESTOR) 40 MG tablet Take 40 mg by mouth daily.   Yes Historical Provider, MD  triamcinolone cream (KENALOG) 0.1 % Apply 1 application topically 2 (two) times daily.   Yes Historical Provider, MD      PHYSICAL EXAMINATION:   VITAL SIGNS: Blood pressure (!) 146/89, pulse 95, temperature 98.1 F (36.7 C), temperature source Oral, resp. rate (!) 21, height 5\' 4"  (1.626 m), weight 68 kg (150 lb), SpO2 99 %.  GENERAL:  55 y.o.-year-old patient lying in the bed with no acute distress.  EYES: Pupils equal, round, reactive to light and accommodation. No scleral icterus. Extraocular muscles intact.  HEENT: Head atraumatic, normocephalic. Oropharynx and nasopharynx clear.  NECK:  Supple, no jugular venous distention. No thyroid enlargement, no tenderness.  LUNGS: Normal breath sounds bilaterally, no wheezing, rales,rhonchi or crepitation. No use of accessory muscles of respiration.  CARDIOVASCULAR: S1, S2 normal. No murmurs, rubs, or gallops.  ABDOMEN: Soft, mild tenderness in epigastrium, nondistended. Bowel sounds present. No organomegaly or mass.  EXTREMITIES: No pedal edema, cyanosis, or clubbing.  NEUROLOGIC: Cranial nerves II through XII are intact. Muscle strength 5/5 in all extremities. Sensation intact. Gait not checked.  PSYCHIATRIC: The patient is alert and oriented x 3.  SKIN: No obvious rash, lesion, or ulcer.   LABORATORY PANEL:   CBC  Recent Labs Lab 10/25/16 0334  WBC  6.4  HGB 11.1*  HCT 32.2*  PLT 264  MCV 79.3*  MCH 27.3  MCHC 34.4  RDW 13.9   ------------------------------------------------------------------------------------------------------------------  Chemistries   Recent Labs Lab 10/25/16 0334  NA 140  K 3.6  CL 105  CO2 27  GLUCOSE 186*  BUN 13  CREATININE 0.94  CALCIUM 9.6  AST 21  ALT 14  ALKPHOS 95  BILITOT 0.3   ------------------------------------------------------------------------------------------------------------------ estimated creatinine clearance is 65.6 mL/min (by C-G formula based on SCr of 0.94 mg/dL). ------------------------------------------------------------------------------------------------------------------ No results for input(s): TSH, T4TOTAL, T3FREE, THYROIDAB in the last 72 hours.  Invalid input(s): FREET3   Coagulation profile No results for input(s): INR, PROTIME in the last 168 hours. ------------------------------------------------------------------------------------------------------------------- No results for input(s): DDIMER in the last 72 hours. -------------------------------------------------------------------------------------------------------------------  Cardiac Enzymes  Recent Labs Lab 10/25/16 0334  TROPONINI <0.03   ------------------------------------------------------------------------------------------------------------------ Invalid input(s): POCBNP  ---------------------------------------------------------------------------------------------------------------  Urinalysis    Component Value Date/Time   COLORURINE Straw 09/27/2013 1946   APPEARANCEUR  Clear 09/27/2013 1946   LABSPEC 1.011 09/27/2013 1946   PHURINE 7.0 09/27/2013 1946   GLUCOSEU Negative 09/27/2013 1946   HGBUR Negative 09/27/2013 1946   BILIRUBINUR Negative 09/27/2013 1946   KETONESUR Negative 09/27/2013 1946   PROTEINUR Negative 09/27/2013 1946   NITRITE Negative 09/27/2013 1946    LEUKOCYTESUR Negative 09/27/2013 1946     RADIOLOGY: Dg Chest 2 View  Result Date: 10/25/2016 CLINICAL DATA:  Chest pain, shortness of breath, and abdominal pain. History of hypertension and diabetes. EXAM: CHEST  2 VIEW COMPARISON:  04/22/2013 FINDINGS: Shallow inspiration. Normal heart size and pulmonary vascularity. No focal airspace disease or consolidation in the lungs. No blunting of costophrenic angles. No pneumothorax. Mediastinal contours appear intact. IMPRESSION: No active cardiopulmonary disease. Electronically Signed   By: Burman NievesWilliam  Stevens M.D.   On: 10/25/2016 04:10    EKG: Orders placed or performed during the hospital encounter of 10/25/16  . EKG 12-Lead  . EKG 12-Lead  . ED EKG within 10 minutes  . ED EKG within 10 minutes    IMPRESSION AND PLAN: 55 year old female patient with history of diabetes mellitus type 2, hypertension, stroke, hyperlipidemia presented to the emergency room with chest pain and epigastric discomfort. Admitting diagnosis 1. Unstable angina 2. Gastritis 3. Hypertension 4. Type 2 diabetes mellitus Treatment plan Admit patient to telemetry observation bed Aspirin 81 mg orally daily Check troponin and echocardiogram Cardiology consultation Resume beta blocker and Crestor Medical management of diabetes mellitus When necessary nitrates for chest pain Supportive care  All the records are reviewed and case discussed with ED provider. Management plans discussed with the patient, family and they are in agreement.  CODE STATUS:FULL CODE Code Status History    This patient does not have a recorded code status. Please follow your organizational policy for patients in this situation.       TOTAL TIME TAKING CARE OF THIS PATIENT: 51 minutes.    Ihor AustinPavan Kinley Ferrentino M.D on 10/25/2016 at 5:07 AM  Between 7am to 6pm - Pager - [phone]  After 6pm go to [email or web address] - password [email or web address] [facility]  [facility]  Office   [phone]  CC: Primary care physician; Rory PercyUHL, LAURA J, MD

## 2016-10-26 ENCOUNTER — Observation Stay (HOSPITAL_BASED_OUTPATIENT_CLINIC_OR_DEPARTMENT_OTHER): Payer: Medicaid Other

## 2016-10-26 ENCOUNTER — Telehealth: Payer: Self-pay

## 2016-10-26 ENCOUNTER — Encounter: Payer: Self-pay | Admitting: Radiology

## 2016-10-26 DIAGNOSIS — R079 Chest pain, unspecified: Secondary | ICD-10-CM

## 2016-10-26 LAB — GLUCOSE, CAPILLARY
GLUCOSE-CAPILLARY: 99 mg/dL (ref 65–99)
Glucose-Capillary: 196 mg/dL — ABNORMAL HIGH (ref 65–99)
Glucose-Capillary: 65 mg/dL (ref 65–99)

## 2016-10-26 LAB — NM MYOCAR MULTI W/SPECT W/WALL MOTION / EF
CHL CUP NUCLEAR SDS: 1
CHL CUP RESTING HR STRESS: 77 {beats}/min
LVDIAVOL: 40 mL (ref 46–106)
LVSYSVOL: 12 mL
NUC STRESS TID: 1.06
Peak HR: 107 {beats}/min
Percent HR: 64 %
SRS: 0
SSS: 1

## 2016-10-26 LAB — ECHOCARDIOGRAM COMPLETE
HEIGHTINCHES: 64 in
Weight: 2400 oz

## 2016-10-26 MED ORDER — TECHNETIUM TC 99M TETROFOSMIN IV KIT
32.5700 | PACK | Freq: Once | INTRAVENOUS | Status: AC | PRN
  Administered 2016-10-26: 32.57 via INTRAVENOUS

## 2016-10-26 MED ORDER — TECHNETIUM TC 99M TETROFOSMIN IV KIT
12.3250 | PACK | Freq: Once | INTRAVENOUS | Status: AC | PRN
  Administered 2016-10-26: 12.325 via INTRAVENOUS

## 2016-10-26 MED ORDER — METOCLOPRAMIDE HCL 5 MG/ML IJ SOLN
10.0000 mg | Freq: Once | INTRAMUSCULAR | Status: AC
  Administered 2016-10-26: 10 mg via INTRAVENOUS
  Filled 2016-10-26: qty 2

## 2016-10-26 MED ORDER — REGADENOSON 0.4 MG/5ML IV SOLN
0.4000 mg | Freq: Once | INTRAVENOUS | Status: AC
  Administered 2016-10-26: 0.4 mg via INTRAVENOUS

## 2016-10-26 NOTE — Progress Notes (Addendum)
After pt. Received Reglan she slept well the rest of the night with no c/o pain, SOB or acute distress or N/V. Pt. Has been NPO since midnight.

## 2016-10-26 NOTE — Discharge Summary (Signed)
[facility] - [place] at [facility]   PATIENT NAME: Mary Cuevas    MR#:  [id number]  DATE OF BIRTH:  June 22, 1961  DATE OF ADMISSION:  10/25/2016 ADMITTING PHYSICIAN: Ihor Austin, MD  DATE OF DISCHARGE: 10/26/2016  PRIMARY CARE PHYSICIAN: Rory Percy, MD    ADMISSION DIAGNOSIS:  Unstable angina (HCC) [I20.0] Chest pain, unspecified type [R07.9]  DISCHARGE DIAGNOSIS:  Principal Problem:   Unstable angina (HCC) Active Problems:   Chest pain   Essential hypertension   Other hyperlipidemia   SECONDARY DIAGNOSIS:   Past Medical History:  Diagnosis Date  . Chest pain   . Diabetes mellitus without complication (HCC)   . DVT (deep vein thrombosis) in pregnancy (HCC)   . Ectopic pregnancy   . Hypertension   . Stroke (cerebrum) [facility])     HOSPITAL COURSE:  55 y/o female with PMHx of diabetes and essential hypertension who presents with dyspnea on exertion and chest pain.   1. DOE: Due to her significant risk factors for CAD including Diabetes, HTN and HLD she underwent cardiac workup. Echocardiogram shows normal ejection fraction without evidence of diastolic dysfunction. She has ruled out for acute coronary syndrome with negative troponins. She also underwent cardiac stress testing which was read as a low risk study.  2. Chest pain: She has ruled out for ACS. She underwent cardiac stress test with results as above. She has follow up with Cardiology on 12/12 to discuss further workup if her symptoms persist..   3. Essential HTN: She will continue HCTZ, lisinopril and Coreg  4. Diabetes: She will continue ADA diet and glipizide Her blood sugars well-controlled 5. Hyperlipidemia on Crestor LDL is 50 which is at goal     DISCHARGE CONDITIONS AND DIET:   stable Diabetic diet  CONSULTS OBTAINED:  Treatment Team:  Almond Lint, MD  DRUG ALLERGIES:   Allergies  Allergen Reactions  . Demerol [Meperidine] Nausea And Vomiting  . Dilaudid  [Hydromorphone Hcl] Nausea And Vomiting and Other (See Comments)    " made me feel like I was crazy" Requests not to take this medication again.  . Vicodin [Hydrocodone-Acetaminophen] Nausea And Vomiting    DISCHARGE MEDICATIONS:   Current Discharge Medication List    CONTINUE these medications which have NOT CHANGED   Details  amLODipine (NORVASC) 10 MG tablet Take 10 mg by mouth daily.    aspirin EC 81 MG tablet Take 81 mg by mouth daily.    Biotin 1000 MCG tablet Take 1,000 mcg by mouth daily.    carvedilol (COREG) 6.25 MG tablet Take 12.5 mg by mouth 2 (two) times daily with a meal.    clobetasol cream (TEMOVATE) 0.05 % Apply 1 application topically 2 (two) times daily.    DULoxetine (CYMBALTA) 30 MG capsule Take 30 mg by mouth daily.    gabapentin (NEURONTIN) 300 MG capsule Take 300 mg by mouth 3 (three) times daily.    glipiZIDE (GLUCOTROL XL) 10 MG 24 hr tablet Take 10 mg by mouth daily with breakfast.    hydrochlorothiazide (HYDRODIURIL) 25 MG tablet Take 25 mg by mouth daily.    hydroxychloroquine (PLAQUENIL) 200 MG tablet Take 200 mg by mouth 2 (two) times daily.    lisinopril (PRINIVIL,ZESTRIL) 40 MG tablet Take 40 mg by mouth daily.    nitroGLYCERIN (NITROSTAT) 0.4 MG SL tablet Place 0.4 mg under the tongue every 5 (five) minutes as needed for chest pain.    omeprazole (PRILOSEC) 20 MG capsule Take 20 mg by  mouth daily.    rosuvastatin (CRESTOR) 40 MG tablet Take 40 mg by mouth daily.    triamcinolone cream (KENALOG) 0.1 % Apply 1 application topically 2 (two) times daily.              Today   CHIEF COMPLAINT:  No SOB overnight   VITAL SIGNS:  Blood pressure 107/66, pulse 73, temperature 97.8 F (36.6 C), temperature source Oral, resp. rate 16, height 5\' 4"  (1.626 m), weight 67.9 kg (149 lb 11.2 oz), SpO2 98 %.   REVIEW OF SYSTEMS:  Review of Systems  Constitutional: Negative.  Negative for chills, fever and malaise/fatigue.  HENT:  Negative.  Negative for ear discharge, ear pain, hearing loss, nosebleeds and sore throat.   Eyes: Negative.  Negative for blurred vision and pain.  Respiratory: Negative.  Negative for cough, hemoptysis, shortness of breath and wheezing.   Cardiovascular: Negative.  Negative for chest pain, palpitations and leg swelling.  Gastrointestinal: Negative.  Negative for abdominal pain, blood in stool, diarrhea, nausea and vomiting.  Genitourinary: Negative.  Negative for dysuria.  Musculoskeletal: Negative.  Negative for back pain.  Skin: Negative.   Neurological: Negative for dizziness, tremors, speech change, focal weakness, seizures and headaches.  Endo/Heme/Allergies: Negative.  Does not bruise/bleed easily.  Psychiatric/Behavioral: Negative.  Negative for depression, hallucinations and suicidal ideas.     PHYSICAL EXAMINATION:  GENERAL:  55 y.o.-year-old patient lying in the bed with no acute distress.  NECK:  Supple, no jugular venous distention. No thyroid enlargement, no tenderness.  LUNGS: Normal breath sounds bilaterally, no wheezing, rales,rhonchi  No use of accessory muscles of respiration.  CARDIOVASCULAR: S1, S2 normal. No murmurs, rubs, or gallops.  ABDOMEN: Soft, non-tender, non-distended. Bowel sounds present. No organomegaly or mass.  EXTREMITIES: No pedal edema, cyanosis, or clubbing.  PSYCHIATRIC: The patient is alert and oriented x 3.  SKIN: No obvious rash, lesion, or ulcer.   DATA REVIEW:   CBC  Recent Labs Lab 10/25/16 0555  WBC 6.3  HGB 11.1*  HCT 31.8*  PLT 254    Chemistries   Recent Labs Lab 10/25/16 0334 10/25/16 0555  NA 140 142  K 3.6 3.7  CL 105 107  CO2 27 27  GLUCOSE 186* 180*  BUN 13 12  CREATININE 0.94 0.87  CALCIUM 9.6 9.5  AST 21  --   ALT 14  --   ALKPHOS 95  --   BILITOT 0.3  --     Cardiac Enzymes  Recent Labs Lab 10/25/16 0555 10/25/16 1139 10/25/16 1737  TROPONINI <0.03 <0.03 <0.03    Microbiology Results   @[email or web address] @  RADIOLOGY:  Dg Chest 2 View  Result Date: 10/25/2016 CLINICAL DATA:  Chest pain, shortness of breath, and abdominal pain. History of hypertension and diabetes. EXAM: CHEST  2 VIEW COMPARISON:  04/22/2013 FINDINGS: Shallow inspiration. Normal heart size and pulmonary vascularity. No focal airspace disease or consolidation in the lungs. No blunting of costophrenic angles. No pneumothorax. Mediastinal contours appear intact. IMPRESSION: No active cardiopulmonary disease. Electronically Signed   By: Burman NievesWilliam  Stevens M.D.   On: 10/25/2016 04:10      Management plans discussed with the patient and she is in agreement. Stable for discharge home  Patient should follow up with pcp  CODE STATUS:     Code Status Orders        Start     Ordered   10/25/16 0548  Full code  Continuous     10/25/16 0547  Code Status History    Date Active Date Inactive Code Status Order ID Comments User Context   This patient has a current code status but no historical code status.      TOTAL TIME TAKING CARE OF THIS PATIENT: 33 minutes.    Note: This dictation was prepared with Dragon dictation along with smaller phrase technology. Any transcriptional errors that result from this process are unintentional.  Earlene Bjelland M.D on 10/26/2016 at 12:29 PM  Between 7am to 6pm - Pager - [phone] After 6pm go to [email or web address] - Social research officer, governmentpassword [email or web address]  [facility] [place] [facility]  Office  [phone]  CC: Primary care physician; Rory PercyUHL, LAURA J, MD

## 2016-10-26 NOTE — Progress Notes (Signed)
   Patient is for nuclear stress test today. Results pending.  

## 2016-10-26 NOTE — Progress Notes (Signed)
Pt. C/o of nausea at 2308 zofran given with mild relief then GI cocktail at 2321, upon reassessment of pt. She was asleep. Pt. Awoke around 0200 stating she was having "stomach pains again". I asked pt. If she would like to try some pain medication. When I returned with the pain medication she stated she had vomited. There was a scant amount of clear liquid on bed sheets. Vital signs taken along with CBG, both WNL. Pain medication returned to [facility]. Dr. Emmit PomfretHugelmeyer paged, new order for Reglan 10mg .

## 2016-10-26 NOTE — Care Management (Signed)
Patient placed in observation for chest pain. Should discharge if stress is negative. Patient without payor.  Current with PCP.  No issues accessing medical care or obtaining her meds

## 2016-10-26 NOTE — Progress Notes (Signed)
[facility] - [place] at [facility]   PATIENT NAME: Mary Cuevas    MR#:  [id number]  DATE OF BIRTH:  [birth year]  SUBJECTIVE:  Patient had episode of vomiting last night. She reports that she has episodes of vomiting at home as well. She is unsure if she has gastroparesis.  Feeling better this morning. He reports no shortness of breath REVIEW OF SYSTEMS:    Review of Systems  Constitutional: Negative.  Negative for chills, fever and malaise/fatigue.  HENT: Negative.  Negative for ear discharge, ear pain, hearing loss, nosebleeds and sore throat.   Eyes: Negative.  Negative for blurred vision and pain.  Respiratory: Negative for cough, hemoptysis, shortness of breath and wheezing.   Cardiovascular: Negative for chest pain, palpitations, orthopnea, leg swelling and PND.  Gastrointestinal: Negative.  Negative for abdominal pain, blood in stool, diarrhea, nausea and vomiting.  Genitourinary: Negative.  Negative for dysuria.  Musculoskeletal: Negative.  Negative for back pain.  Skin: Negative.   Neurological: Negative for dizziness, tremors, speech change, focal weakness, seizures and headaches.  Endo/Heme/Allergies: Negative.  Does not bruise/bleed easily.  Psychiatric/Behavioral: Negative.  Negative for depression, hallucinations and suicidal ideas.    Tolerating Diet: yes      DRUG ALLERGIES:   Allergies  Allergen Reactions  . Demerol [Meperidine] Nausea And Vomiting  . Dilaudid [Hydromorphone Hcl] Nausea And Vomiting and Other (See Comments)    " made me feel like I was crazy" Requests not to take this medication again.  . Vicodin [Hydrocodone-Acetaminophen] Nausea And Vomiting    VITALS:  Blood pressure 107/66, pulse 73, temperature 97.8 F (36.6 C), temperature source Oral, resp. rate 16, height 5\' 4"  (1.626 m), weight 67.9 kg (149 lb 11.2 oz), SpO2 98 %.  PHYSICAL EXAMINATION:   Physical Exam  Constitutional: She is oriented to person, place,  and time and well-developed, well-nourished, and in no distress. No distress.  HENT:  Head: Normocephalic.  Eyes: No scleral icterus.  Neck: Normal range of motion. Neck supple. No JVD present. No tracheal deviation present.  Cardiovascular: Normal rate, regular rhythm and normal heart sounds.  Exam reveals no gallop and no friction rub.   No murmur heard. Pulmonary/Chest: Effort normal and breath sounds normal. No respiratory distress. She has no wheezes. She has no rales. She exhibits no tenderness.  Abdominal: Soft. Bowel sounds are normal. She exhibits no distension and no mass. There is no tenderness. There is no rebound and no guarding.  Musculoskeletal: Normal range of motion. She exhibits edema.  Neurological: She is alert and oriented to person, place, and time.  Skin: Skin is warm. No rash noted. No erythema.  Psychiatric: Affect and judgment normal.      LABORATORY PANEL:   CBC  Recent Labs Lab 10/25/16 0555  WBC 6.3  HGB 11.1*  HCT 31.8*  PLT 254   ------------------------------------------------------------------------------------------------------------------  Chemistries   Recent Labs Lab 10/25/16 0334 10/25/16 0555  NA 140 142  K 3.6 3.7  CL 105 107  CO2 27 27  GLUCOSE 186* 180*  BUN 13 12  CREATININE 0.94 0.87  CALCIUM 9.6 9.5  AST 21  --   ALT 14  --   ALKPHOS 95  --   BILITOT 0.3  --    ------------------------------------------------------------------------------------------------------------------  Cardiac Enzymes  Recent Labs Lab 10/25/16 0555 10/25/16 1139 10/25/16 1737  TROPONINI <0.03 <0.03 <0.03   ------------------------------------------------------------------------------------------------------------------  RADIOLOGY:  Dg Chest 2 View  Result Date: 10/25/2016 CLINICAL DATA:  Chest pain, shortness  of breath, and abdominal pain. History of hypertension and diabetes. EXAM: CHEST  2 VIEW COMPARISON:  04/22/2013 FINDINGS:  Shallow inspiration. Normal heart size and pulmonary vascularity. No focal airspace disease or consolidation in the lungs. No blunting of costophrenic angles. No pneumothorax. Mediastinal contours appear intact. IMPRESSION: No active cardiopulmonary disease. Electronically Signed   By: Burman NievesWilliam  Stevens M.D.   On: 10/25/2016 04:10     ASSESSMENT AND PLAN:   55 y/o female with PMHx of diabetes and essential hypertension who presents with dyspnea on exertion and chest pain.   1. DOE: Echocardiogram shows normal ejection fraction without evidence of diastolic dysfunction. She was evaluated by cardiology. She has ruled out for acute coronary syndrome with negative troponins.    2. Chest pain: She has ruled out for ACS. She underwent cardiac stress test.   3. Essential HTN: She will continue HCTZ, lisinopril and Coreg  4. Diabetes: She will continue ADA diet and glipizide At sugars well-controlled 5. Hyperlipidemia on Crestor LDL is 50 which is at goal    Management plans discussed with the patient and she  is in agreement.  CODE STATUS: full  TOTAL TIME TAKING CARE OF THIS PATIENT: 25 minutes.  Rounded with nursing staff   POSSIBLE D/C today, DEPENDING ON CLINICAL CONDITION.   Danyell Shader M.D on 10/26/2016 at 12:25 PM  Between 7am to 6pm - Pager - [phone] After 6pm go to [email or web address] - password [email or web address]  [facility] [place] [facility]  Office  [phone]  CC: Primary care physician; Rory PercyUHL, LAURA J, MD  Note: This dictation was prepared with Dragon dictation along with smaller phrase technology. Any transcriptional errors that result from this process are unintentional.

## 2016-10-26 NOTE — Telephone Encounter (Signed)
[facility].  Patient understands to follow up with Eula Listenyan Dunn, PA on [facility]10[facility]16 [facility]. Patient understands discharge instructions? yes Patient understands medications and regiment? yes Patient understands to bring all medications to this visit? yes

## 2016-10-26 NOTE — Telephone Encounter (Signed)
-----   Message from Coralee RudSabrina F Gilley sent at 10/26/2016  1:06 PM EST ----- Regarding: tcm/ph 12/12  2:30 Eula Listenyan Dunn, PA

## 2016-11-04 ENCOUNTER — Encounter: Payer: Self-pay | Admitting: Physician Assistant

## 2016-11-10 ENCOUNTER — Ambulatory Visit (INDEPENDENT_AMBULATORY_CARE_PROVIDER_SITE_OTHER): Payer: Self-pay | Admitting: Physician Assistant

## 2016-11-10 ENCOUNTER — Encounter: Payer: Self-pay | Admitting: Physician Assistant

## 2016-11-10 VITALS — BP 120/78 | HR 84 | Ht 63.0 in | Wt 155.0 lb

## 2016-11-10 DIAGNOSIS — I10 Essential (primary) hypertension: Secondary | ICD-10-CM

## 2016-11-10 DIAGNOSIS — I200 Unstable angina: Secondary | ICD-10-CM

## 2016-11-10 DIAGNOSIS — E782 Mixed hyperlipidemia: Secondary | ICD-10-CM

## 2016-11-10 DIAGNOSIS — R079 Chest pain, unspecified: Secondary | ICD-10-CM

## 2016-11-10 MED ORDER — ISOSORBIDE MONONITRATE ER 30 MG PO TB24: 30.0000 mg | ORAL_TABLET | Freq: Every day | ORAL | 6 refills | Status: DC

## 2016-11-10 NOTE — Patient Instructions (Addendum)
Medication Instructions:  Your physician has recommended you make the following change in your medication:  1. START Isosorbide mononitrate (Imdur) 30 mg once daily  Labwork: Labs done today are for your pre-procedure labs.   Testing/Procedures: [facility] Cardiac Cath Instructions   You are scheduled for a Cardiac Cath on: Wednesday November 18, 2016 at 9:30AM  Please arrive at 08:30am on the day of your procedure  Please expect a call from our [facility] to pre-register you  Do not eat/drink anything after midnight  Someone will need to drive you home  It is recommended someone be with you for the first 24 hours after your procedure  Wear clothes that are easy to get on/off and wear slip on shoes if possible   Medications bring a current list of all medications with you  _X__ Do not take these medications before your procedure: Carvedilol (Coreg), Glipizide (Glucotrol), Hydrochlorothiazide (Hydrodiuril), isosorbide mononitrate (Imdur), or Lisinopril (Prinivil, Zestril).  Day of your procedure: [facility] at [facility] entrance.  Free valet service is available.  After entering the Medical Mall please check-in at the registration desk (1st desk on your right) to receive your armband. After receiving your armband someone will escort you to the cardiac cath/special procedures waiting area.  The usual length of stay after your procedure is about 2 to 3 hours.  This can vary.  If you have any questions, please call our office at [phone], or you may call the cardiac cath lab at [facility] directly at [phone]   Follow-Up: Your physician recommends that you schedule a follow-up appointment after your procedure.   It was a pleasure seeing you today here in the office. Please do not hesitate to give us a call back if you have any further questions. [phone]  Countryside CellarPamela A. RN, BSN      Coronary Angiogram A coronary angiogram is an X-ray procedure that is  used to examine the arteries in the heart. In this procedure, a dye (contrast dye) is injected through a long, thin tube (catheter). The catheter is inserted through the groin, wrist, or arm. The dye is injected into each artery, then X-rays are taken to show if there is a blockage in the arteries of the heart. This procedure can also show if you have valve disease or a disease of the aorta, and it can be used to check the overall function of your heart muscle. You may have a coronary angiogram if:  You are having chest pain, or other symptoms of angina, and you are at risk for heart disease.  You have an abnormal electrocardiogram (ECG) or stress test.  You have chest pain and heart failure.  You are having irregular heart rhythms.  You and your health care provider determine that the benefits of the test information outweigh the risks of the procedure. Let your health care provider know about:  Any allergies you have, including allergies to contrast dye.  All medicines you are taking, including vitamins, herbs, eye drops, creams, and over-the-counter medicines.  Any problems you or family members have had with anesthetic medicines.  Any blood disorders you have.  Any surgeries you have had.  History of kidney problems or kidney failure.  Any medical conditions you have.  Whether you are pregnant or may be pregnant. What are the risks? Generally, this is a safe procedure. However, problems may occur, including:  Infection.  Allergic reaction to medicines or dyes that are used.  Bleeding from the access site  or other locations.  Kidney injury, especially in people with impaired kidney function.  Stroke (rare).  Heart attack (rare).  Damage to other structures or organs. What happens before the procedure? Staying hydrated  Follow instructions from your health care provider about hydration, which may include:  Up to 2 hours before the procedure - you may continue to  drink clear liquids, such as water, clear fruit juice, black coffee, and plain tea. Eating and drinking restrictions  Follow instructions from your health care provider about eating and drinking, which may include:  8 hours before the procedure - stop eating heavy meals or foods such as meat, fried foods, or fatty foods.  6 hours before the procedure - stop eating light meals or foods, such as toast or cereal.  2 hours before the procedure - stop drinking clear liquids. General instructions  Ask your health care provider about:  Changing or stopping your regular medicines. This is especially important if you are taking diabetes medicines or blood thinners.  Taking medicines such as ibuprofen. These medicines can thin your blood. Do not take these medicines before your procedure if your health care provider instructs you not to, though aspirin may be recommended prior to coronary angiograms.  Plan to have someone take you home from the hospital or clinic.  You may need to have blood tests or X-rays done. What happens during the procedure?  An IV tube will be inserted into one of your veins.  You will be given one or more of the following:  A medicine to help you relax (sedative).  A medicine to numb the area where the catheter will be inserted into an artery (local anesthetic).  To reduce your risk of infection:  Your health care team will wash or sanitize their hands.  Your skin will be washed with soap.  Hair may be removed from the area where the catheter will be inserted.  You will be connected to a continuous ECG monitor.  The catheter will be inserted into an artery. The location may be in your groin, in your wrist, or in the fold of your arm (near your elbow).  A type of X-ray (fluoroscopy) will be used to help guide the catheter to the opening of the blood vessel that is being examined.  A dye will be injected into the catheter, and X-rays will be taken. The dye  will help to show where any narrowing or blockages are located in the heart arteries.  Tell your health care provider if you have any chest pain or trouble breathing during the procedure.  If blockages are found, your health care provider may perform another procedure, such as inserting a coronary stent. The procedure may vary among health care providers and hospitals. What happens after the procedure?  After the procedure, you will need to keep the area still for a few hours, or for as long as told by your health care provider. If the procedure is done through the groin, you will be instructed to not bend and not cross your legs.  The insertion site will be checked frequently.  The pulse in your foot or wrist will be checked frequently.  You may have additional blood tests, X-rays, and a test that records the electrical activity of your heart (ECG).  Do not drive for 24 hours if you were given a sedative. Summary  A coronary angiogram is an X-ray procedure that is used to look into the arteries in the heart.  During the  procedure, a dye (contrast dye) is injected through a long, thin tube (catheter). The catheter is inserted through the groin, wrist, or arm.  Tell your health care provider about any allergies you have, including allergies to contrast dye.  After the procedure, you will need to keep the area still for a few hours, or for as long as told by your health care provider. This information is not intended to replace advice given to you by your health care provider. Make sure you discuss any questions you have with your health care provider. Document Released: 05/23/2003 Document Revised: 08/28/2016 Document Reviewed: 08/28/2016 Elsevier Interactive Patient Education  2017 [employer]

## 2016-11-10 NOTE — Progress Notes (Signed)
Cardiology Office Note Date:  11/10/2016  Patient ID:  Mary Cuevas, DOB [birth year], MRN [id number] PCP:  Rory PercyUHL, LAURA J, MD  Cardiologist:  [facility]    Chief Complaint: Hospital follow up  History of Present Illness: Mary Cuevas is a 55 y.o. female with history of HTN, DM, HLD, DVT, PUD, and fibromyalgia who presents for hospital follow up after her recent admission to [facility] from [facility]26 to 11/27 for exertional SOB and chest pain.   She was previously seen by Dr. Okey DupreEnd while [facility] for SOB. At that time she underwent TTE which was normal with exception of diastolic dysfunction. Subsequent PET/CT stress test was also normal. At her last follow up in 11/2014 she continued to note SOB that was primarily exertional in the setting of an unrevealing cardiac workup with the exception of mild LVH and diastolic dysfunction. No further cardiac workup was necessary at that time. She presented to [facility] on with several month history of chest pain that was sharp in nature. Cardiac enzymes were negative. EKG non-acute, CXR negative. Echo with EF 55-60%, normal wall motion, normal LV diastolic function, no significant valvualr abnormalities. She underwent Lexiscan Myoview on 11/27 that showed a small defect of mild severity present in the apex location felt to be 2/2 breast attenuation. No ST segment deviation during stress. EF 55-65%. Overall, low risk study.   Since her admission, she has continued to have substernal chest pain that radiates up to the left side of her neck and left shoulder. Pain will last approximately 15-20 minutes before self resolving. It is not associated with exertion. Some associated SOB and generalized fatigue. No associated diaphoresis, nausea, vomiting, dizziness, presyncope, or syncope. She last had the pain 2 days ago while sitting in the bed. She reports her fatigue is about the same as her work up in 2016, though now with worsening chest pain over the past couple of  months. She is currently symptom-free.    Past Medical History:  Diagnosis Date  . Chest pain   . Diabetes mellitus without complication (HCC)   . DVT (deep vein thrombosis) in pregnancy (HCC)   . Ectopic pregnancy   . Hypertension   . 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 ABDOMINAL HYSTERECTOMY    . KNEE ARTHROSCOPY      Current Outpatient Prescriptions  Medication Sig Dispense Refill  . amLODipine (NORVASC) 10 MG tablet Take 10 mg by mouth daily.    Marland Kitchen. aspirin EC 81 MG tablet Take 81 mg by mouth daily.    . Biotin 1000 MCG tablet Take 1,000 mcg by mouth daily.    . carvedilol (COREG) 6.25 MG tablet Take 12.5 mg by mouth 2 (two) times daily with a meal.    . clobetasol cream (TEMOVATE) 0.05 % Apply 1 application topically 2 (two) times daily.    . DULoxetine (CYMBALTA) 30 MG capsule Take 30 mg by mouth daily.    Marland Kitchen. gabapentin (NEURONTIN) 300 MG capsule Take 300 mg by mouth 3 (three) times daily.    Marland Kitchen. glipiZIDE (GLUCOTROL XL) 10 MG 24 hr tablet Take 10 mg by mouth daily with breakfast.    . hydrochlorothiazide (HYDRODIURIL) 25 MG tablet Take 25 mg by mouth daily.    . hydroxychloroquine (PLAQUENIL) 200 MG tablet Take 200 mg by mouth 2 (two) times daily.    Marland Kitchen. lisinopril (PRINIVIL,ZESTRIL) 40 MG tablet Take 40 mg by mouth daily.    .Marland Kitchen  nitroGLYCERIN (NITROSTAT) 0.4 MG SL tablet Place 0.4 mg under the tongue every 5 (five) minutes as needed for chest pain.    Marland Kitchen. omeprazole (PRILOSEC) 20 MG capsule Take 20 mg by mouth daily.    . rosuvastatin (CRESTOR) 40 MG tablet Take 40 mg by mouth daily.    Marland Kitchen. triamcinolone cream (KENALOG) 0.1 % Apply 1 application topically 2 (two) times daily.    . isosorbide mononitrate (IMDUR) 30 MG 24 hr tablet Take 1 tablet (30 mg total) by mouth daily. 30 tablet 6   No current facility-administered medications for this visit.     Allergies:   Demerol [meperidine]; Dilaudid [hydromorphone hcl]; and Vicodin  [hydrocodone-acetaminophen]   Social History:  The patient  reports that she has never smoked. She has never used smokeless tobacco. She reports that she does not drink alcohol or use drugs.   Family History:  The patient's family history includes Breast cancer in her cousin and maternal aunt.  ROS:   Review of Systems  Constitutional: Positive for malaise/fatigue. Negative for chills, diaphoresis, fever and weight loss.  HENT: Negative for congestion.   Eyes: Negative for discharge and redness.  Respiratory: Positive for shortness of breath. Negative for cough, hemoptysis, sputum production and wheezing.   Cardiovascular: Positive for chest pain. Negative for palpitations, orthopnea, claudication, leg swelling and PND.  Gastrointestinal: Negative for abdominal pain, blood in stool, heartburn, melena, nausea and vomiting.  Genitourinary: Negative for hematuria.  Musculoskeletal: Negative for falls and myalgias.  Skin: Negative for rash.  Neurological: Positive for weakness. Negative for dizziness, tingling, tremors, sensory change, speech change, focal weakness and loss of consciousness.  Endo/Heme/Allergies: Does not bruise/bleed easily.  Psychiatric/Behavioral: Negative for substance abuse. The patient is nervous/anxious.   All other systems reviewed and are negative.    PHYSICAL EXAM:  VS:  BP 120/78 (BP Location: Left Arm, Patient Position: Sitting, Cuff Size: Normal)   Pulse 84   Ht 5\' 3"  (1.6 m)   Wt 155 lb (70.3 kg)   BMI 27.46 kg/m  BMI: Body mass index is 27.46 kg/m.  Physical Exam  Constitutional: She is oriented to person, place, and time. She appears well-developed and well-nourished.  HENT:  Head: Normocephalic and atraumatic.  Eyes: Right eye exhibits no discharge. Left eye exhibits no discharge.  Neck: Normal range of motion. No JVD present.  Cardiovascular: Normal rate, regular rhythm, S1 normal, S2 normal and normal heart sounds.  Exam reveals no distant heart  sounds, no friction rub, no midsystolic click and no opening snap.   No murmur heard. Pulmonary/Chest: Effort normal and breath sounds normal. No respiratory distress. She has no decreased breath sounds. She has no wheezes. She has no rales. She exhibits no tenderness.  Abdominal: Soft. She exhibits no distension. There is no tenderness.  Musculoskeletal: She exhibits no edema.  Neurological: She is alert and oriented to person, place, and time.  Skin: Skin is warm and dry. No cyanosis. Nails show no clubbing.  Psychiatric: She has a normal mood and affect. Her speech is normal and behavior is normal. Judgment and thought content normal.     EKG:  Was ordered and interpreted by me today. Shows NSR, 84 bpm, nonspecific st/t changes  Recent Labs: 10/25/2016: ALT 14; BUN 12; Creatinine, Ser 0.87; Hemoglobin 11.1; Platelets 254; Potassium 3.7; Sodium 142  10/25/2016: Cholesterol 130; HDL 51; LDL Cholesterol 50; Total CHOL/HDL Ratio 2.5; Triglycerides 146; VLDL 29   Estimated Creatinine Clearance: 70.4 mL/min (by C-G formula based on  SCr of 0.87 mg/dL).   Wt Readings from Last 3 Encounters:  11/10/16 155 lb (70.3 kg)  10/26/16 149 lb 11.2 oz (67.9 kg)  05/06/16 155 lb 13.8 oz (70.7 kg)     Other studies reviewed: Additional studies/records reviewed today include: summarized above  ASSESSMENT AND PLAN:  1. Unstable angina: Currently chest pain free. Schedule for LHC with Dr. Okey Dupre. Add Imdur 30 mg daily. Continue ASA 81 mg daily. Continue Coreg 6.25 mg bid. Risks and benefits of cardiac catheterization have been discussed with the patient including risks of bleeding, bruising, infection, kidney damage, stroke, heart attack, and death. The patient understands these risks and is willing to proceed with the procedure. All questions have been answered and concerns listened to.   2. HTN: Well controlled. Continue current medications.   3. HLD: Well controlled. Continue Crestor.   Disposition:  F/u with me s/p cardiac cath.  Current medicines are reviewed at length with the patient today.  The patient did not have any concerns regarding medicines.  Elinor Dodge PA-C 11/10/2016 3:25 PM     [facility] [place] [address] [place], [place] [address] [phone]

## 2016-11-11 ENCOUNTER — Telehealth: Payer: Self-pay | Admitting: *Deleted

## 2016-11-11 ENCOUNTER — Other Ambulatory Visit
Admission: RE | Admit: 2016-11-11 | Discharge: 2016-11-11 | Disposition: A | Discharge status: Home / Self Care | Payer: Medicaid Other | Source: Ambulatory Visit | Attending: Physician Assistant | Admitting: Physician Assistant

## 2016-11-11 DIAGNOSIS — Z01818 Encounter for other preprocedural examination: Secondary | ICD-10-CM

## 2016-11-11 DIAGNOSIS — Z01812 Encounter for preprocedural laboratory examination: Secondary | ICD-10-CM | POA: Insufficient documentation

## 2016-11-11 LAB — CBC WITH DIFFERENTIAL/PLATELET
Basophils Absolute: 0 10*3/uL (ref 0–0.1)
Basophils Relative: 1 %
EOS ABS: 0.1 10*3/uL (ref 0–0.7)
EOS PCT: 3 %
HCT: 32.2 % — ABNORMAL LOW (ref 35.0–47.0)
Hemoglobin: 11.1 g/dL — ABNORMAL LOW (ref 12.0–16.0)
LYMPHS ABS: 1.7 10*3/uL (ref 1.0–3.6)
LYMPHS PCT: 32 %
MCH: 27 pg (ref 26.0–34.0)
MCHC: 34.3 g/dL (ref 32.0–36.0)
MCV: 78.5 fL — AB (ref 80.0–100.0)
MONO ABS: 0.3 10*3/uL (ref 0.2–0.9)
MONOS PCT: 5 %
Neutro Abs: 3.3 10*3/uL (ref 1.4–6.5)
Neutrophils Relative %: 59 %
PLATELETS: 282 10*3/uL (ref 150–440)
RBC: 4.1 MIL/uL (ref 3.80–5.20)
RDW: 14.5 % (ref 11.5–14.5)
WBC: 5.4 10*3/uL (ref 3.6–11.0)

## 2016-11-11 LAB — BASIC METABOLIC PANEL
BUN/Creatinine Ratio: 19 (ref 9–23)
BUN: 13 mg/dL (ref 6–24)
CALCIUM: 9.8 mg/dL (ref 8.7–10.2)
CHLORIDE: 100 mmol/L (ref 96–106)
CO2: 27 mmol/L (ref 18–29)
Creatinine, Ser: 0.67 mg/dL (ref 0.57–1.00)
GFR, EST AFRICAN AMERICAN: 116 mL/min/{1.73_m2} (ref >59)
GFR, EST NON AFRICAN AMERICAN: 101 mL/min/{1.73_m2} (ref >59)
Glucose: 81 mg/dL (ref 65–99)
POTASSIUM: 3.8 mmol/L (ref 3.5–5.2)
SODIUM: 141 mmol/L (ref 134–144)

## 2016-11-11 LAB — PROTIME-INR
INR: 0.99
PROTHROMBIN TIME: 13.1 s (ref 11.4–15.2)

## 2016-11-11 NOTE — Telephone Encounter (Signed)
-----   Message from Sondra Bargesyan M Dunn, PA-C sent at 11/11/2016  8:19 AM EST ----- Yes, Please call patient this morning as her PT/INR and cbc were not ran. She needs these redrawn this week prior to her cardiac cath.   ----- Message ----- From: Bryna ColanderPamela S Wayne Brunker, RN Sent: 11/11/2016   7:20 AM To: Sondra Bargesyan M Dunn, PA-C  Left heart Cath scheduled for 11/18/16 at 09:30AM. Pre-op labs completed. Please let me know if you have any questions.  Thanks, [place] CellarPamela A.

## 2016-11-11 NOTE — Telephone Encounter (Signed)
Spoke with patient and let her know that we needed to repeat some of her labs. Instructed her to please go to the Medical Mall Entrance to have those done today. She verbalized understanding and had no further questions at this time.

## 2016-11-17 ENCOUNTER — Telehealth: Payer: Self-pay | Admitting: Internal Medicine

## 2016-11-17 NOTE — Telephone Encounter (Signed)
Reviewed procedure information along with instructions, time, and medications to hold prior to her procedure tomorrow. She verbalized understanding of all instructions and had no further questions at this time.

## 2016-11-18 ENCOUNTER — Ambulatory Visit
Admission: RE | Admit: 2016-11-18 | Discharge: 2016-11-18 | Disposition: A | Discharge status: Home / Self Care | Payer: Medicaid Other | Source: Ambulatory Visit | Attending: Internal Medicine | Admitting: Internal Medicine

## 2016-11-18 ENCOUNTER — Encounter
Admission: RE | Disposition: A | Discharge status: Home / Self Care | Payer: Self-pay | Source: Ambulatory Visit | Attending: Internal Medicine

## 2016-11-18 ENCOUNTER — Encounter: Payer: Self-pay | Admitting: *Deleted

## 2016-11-18 DIAGNOSIS — Z86718 Personal history of other venous thrombosis and embolism: Secondary | ICD-10-CM | POA: Diagnosis not present

## 2016-11-18 DIAGNOSIS — R072 Precordial pain: Secondary | ICD-10-CM | POA: Diagnosis not present

## 2016-11-18 DIAGNOSIS — Z7984 Long term (current) use of oral hypoglycemic drugs: Secondary | ICD-10-CM | POA: Insufficient documentation

## 2016-11-18 DIAGNOSIS — Z7982 Long term (current) use of aspirin: Secondary | ICD-10-CM | POA: Insufficient documentation

## 2016-11-18 DIAGNOSIS — M797 Fibromyalgia: Secondary | ICD-10-CM | POA: Insufficient documentation

## 2016-11-18 DIAGNOSIS — E119 Type 2 diabetes mellitus without complications: Secondary | ICD-10-CM | POA: Insufficient documentation

## 2016-11-18 DIAGNOSIS — Z8711 Personal history of peptic ulcer disease: Secondary | ICD-10-CM | POA: Insufficient documentation

## 2016-11-18 DIAGNOSIS — R079 Chest pain, unspecified: Secondary | ICD-10-CM | POA: Diagnosis present

## 2016-11-18 DIAGNOSIS — Z79899 Other long term (current) drug therapy: Secondary | ICD-10-CM | POA: Diagnosis not present

## 2016-11-18 DIAGNOSIS — I10 Essential (primary) hypertension: Secondary | ICD-10-CM | POA: Insufficient documentation

## 2016-11-18 DIAGNOSIS — E785 Hyperlipidemia, unspecified: Secondary | ICD-10-CM | POA: Diagnosis not present

## 2016-11-18 DIAGNOSIS — Z8673 Personal history of transient ischemic attack (TIA), and cerebral infarction without residual deficits: Secondary | ICD-10-CM | POA: Insufficient documentation

## 2016-11-18 HISTORY — PX: CARDIAC CATHETERIZATION: SHX172

## 2016-11-18 SURGERY — LEFT HEART CATH AND CORONARY ANGIOGRAPHY
Anesthesia: Moderate Sedation

## 2016-11-18 MED ORDER — SODIUM CHLORIDE 0.9% FLUSH: 3.0000 mL | INTRAVENOUS | Status: DC | PRN

## 2016-11-18 MED ORDER — MIDAZOLAM HCL 2 MG/2ML IJ SOLN
INTRAMUSCULAR | Status: DC | PRN
  Administered 2016-11-18: 1 mg via INTRAVENOUS

## 2016-11-18 MED ORDER — SODIUM CHLORIDE 0.9 % WEIGHT BASED INFUSION: 1.0000 mL/kg/h | INTRAVENOUS | Status: DC

## 2016-11-18 MED ORDER — SODIUM CHLORIDE 0.9 % WEIGHT BASED INFUSION: 3.0000 mL/kg/h | INTRAVENOUS | Status: AC

## 2016-11-18 MED ORDER — SODIUM CHLORIDE 0.9% FLUSH: 3.0000 mL | Freq: Two times a day (BID) | INTRAVENOUS | Status: DC

## 2016-11-18 MED ORDER — SODIUM CHLORIDE 0.9 % IV SOLN: 250.0000 mL | INTRAVENOUS | Status: DC | PRN

## 2016-11-18 MED ORDER — FENTANYL CITRATE (PF) 100 MCG/2ML IJ SOLN
INTRAMUSCULAR | Status: DC | PRN
  Administered 2016-11-18: 25 ug via INTRAVENOUS

## 2016-11-18 MED ORDER — MIDAZOLAM HCL 2 MG/2ML IJ SOLN
INTRAMUSCULAR | Status: AC
  Filled 2016-11-18: qty 2

## 2016-11-18 MED ORDER — ASPIRIN 81 MG PO CHEW: 81.0000 mg | CHEWABLE_TABLET | ORAL | Status: DC

## 2016-11-18 MED ORDER — HEPARIN SODIUM (PORCINE) 1000 UNIT/ML IJ SOLN
INTRAMUSCULAR | Status: AC
  Filled 2016-11-18: qty 1

## 2016-11-18 MED ORDER — HEPARIN (PORCINE) IN NACL 2-0.9 UNIT/ML-% IJ SOLN
INTRAMUSCULAR | Status: AC
  Filled 2016-11-18: qty 500

## 2016-11-18 MED ORDER — HEPARIN SODIUM (PORCINE) 1000 UNIT/ML IJ SOLN
INTRAMUSCULAR | Status: DC | PRN
  Administered 2016-11-18: 3500 [IU] via INTRAVENOUS

## 2016-11-18 MED ORDER — SODIUM CHLORIDE 0.9 % IV SOLN: INTRAVENOUS | Status: DC

## 2016-11-18 MED ORDER — VERAPAMIL HCL 2.5 MG/ML IV SOLN
INTRAVENOUS | Status: AC
  Filled 2016-11-18: qty 2

## 2016-11-18 MED ORDER — CARVEDILOL 25 MG PO TABS: 25.0000 mg | ORAL_TABLET | Freq: Two times a day (BID) | ORAL | 5 refills | Status: DC

## 2016-11-18 MED ORDER — FENTANYL CITRATE (PF) 100 MCG/2ML IJ SOLN
INTRAMUSCULAR | Status: AC
  Filled 2016-11-18: qty 2

## 2016-11-18 SURGICAL SUPPLY — 7 items
CATH 5F 110X4 TIG (CATHETERS) ×3 IMPLANT
CATH 5FR PIGTAIL DIAGNOSTIC (CATHETERS) ×3 IMPLANT
DEVICE RAD TR BAND REGULAR (VASCULAR PRODUCTS) ×3 IMPLANT
GLIDESHEATH SLEND SS 6F .021 (SHEATH) ×3 IMPLANT
KIT MANI 3VAL PERCEP (MISCELLANEOUS) ×3 IMPLANT
PACK CARDIAC CATH (CUSTOM PROCEDURE TRAY) ×3 IMPLANT
WIRE ROSEN-J .035X260CM (WIRE) ×3 IMPLANT

## 2016-11-18 NOTE — Interval H&P Note (Signed)
History and Physical Interval Note:  11/18/2016 9:14 AM  Mary Cuevas  has presented today for cardiac catheterization, with the diagnosis of chest pain. The various methods of treatment have been discussed with the patient and family. After consideration of risks, benefits and other options for treatment, the patient has consented to  Procedure(s): Left Heart Cath and Coronary Angiography (N/A) as a surgical intervention .  The patient's history has been reviewed, patient examined, no change in status, stable for surgery.  I have reviewed the patient's chart and labs.  Questions were answered to the patient's satisfaction.    Cath Lab Visit (complete for each Cath Lab visit)  Clinical Evaluation Leading to the Procedure:   ACS: No.  Non-ACS:    Anginal Classification: CCS III  Anti-ischemic medical therapy: Maximal Therapy (2 or more classes of medications)  Non-Invasive Test Results: Low-risk stress test findings: cardiac mortality <1%/year  Prior CABG: No previous CABG  Herve Haug

## 2016-11-18 NOTE — Brief Op Note (Signed)
Brief Cardiac Catheterization Note (Full Report to Follow)  Date: 11/18/2016 Time: 11:09 AM  PATIENT:  Mary Cuevas  55 y.o. female  PRE-OPERATIVE DIAGNOSIS:  Chest pain  POST-OPERATIVE DIAGNOSIS:  Normal coronary arteries  PROCEDURE:  Procedure(s): Left Heart Cath and Coronary Angiography (N/A)  SURGEON:  Surgeon(s) and Role:    * Yvonne Kendallhristopher Ky Rumple, MD - Primary  FINDINGS: 1. No CAD. 2. Mildly elevated LVEDP.  RECOMMENDATIONS: 1. Medical management and evaluation for non-cardiac causes of chest pain.  Yvonne Kendallhristopher Henchy Mccauley, MD [facility] Pager: [phone]

## 2016-11-18 NOTE — Outcomes Assessment (Signed)
Pt had a rebleed of TR band pressure held and 6 ml of air placed.  Will wait for 30 minutes and then deflate again.

## 2016-11-18 NOTE — H&P (View-Only) (Signed)
Cardiology Office Note Date:  11/10/2016  Patient ID:  Mary Cuevas, DOB [birth year], MRN [id number] PCP:  Rory PercyUHL, LAURA J, MD  Cardiologist:  [facility]    Chief Complaint: Hospital follow up  History of Present Illness: Mary Cuevas is a 55 y.o. female with history of HTN, DM, HLD, DVT, PUD, and fibromyalgia who presents for hospital follow up after her recent admission to [facility] from 11/26 to 11/27 for exertional SOB and chest pain.   She was previously seen by Dr. Okey DupreEnd while at [facility] in [facility] for SOB. At that time she underwent TTE which was normal with exception of diastolic dysfunction. Subsequent PET/CT stress test was also normal. At her last follow up in 11/2014 she continued to note SOB that was primarily exertional in the setting of an unrevealing cardiac workup with the exception of mild LVH and diastolic dysfunction. No further cardiac workup was necessary at that time. She presented to [facility] on with several month history of chest pain that was sharp in nature. Cardiac enzymes were negative. EKG non-acute, CXR negative. Echo with EF 55-60%, normal wall motion, normal LV diastolic function, no significant valvualr abnormalities. She underwent Lexiscan Myoview on 11/27 that showed a small defect of mild severity present in the apex location felt to be 2/2 breast attenuation. No ST segment deviation during stress. EF 55-65%. Overall, low risk study.   Since her admission, she has continued to have substernal chest pain that radiates up to the left side of her neck and left shoulder. Pain will last approximately 15-20 minutes before self resolving. It is not associated with exertion. Some associated SOB and generalized fatigue. No associated diaphoresis, nausea, vomiting, dizziness, presyncope, or syncope. She last had the pain 2 days ago while sitting in the bed. She reports her fatigue is about the same as her work up in 2016, though now with worsening chest pain over the past couple of  months. She is currently symptom-free.    Past Medical History:  Diagnosis Date  . Chest pain   . Diabetes mellitus without complication (HCC)   . DVT (deep vein thrombosis) in pregnancy (HCC)   . Ectopic pregnancy   . Hypertension   . 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 ABDOMINAL HYSTERECTOMY    . KNEE ARTHROSCOPY      Current Outpatient Prescriptions  Medication Sig Dispense Refill  . amLODipine (NORVASC) 10 MG tablet Take 10 mg by mouth daily.    Marland Kitchen. aspirin EC 81 MG tablet Take 81 mg by mouth daily.    . Biotin 1000 MCG tablet Take 1,000 mcg by mouth daily.    . carvedilol (COREG) 6.25 MG tablet Take 12.5 mg by mouth 2 (two) times daily with a meal.    . clobetasol cream (TEMOVATE) 0.05 % Apply 1 application topically 2 (two) times daily.    . DULoxetine (CYMBALTA) 30 MG capsule Take 30 mg by mouth daily.    Marland Kitchen. gabapentin (NEURONTIN) 300 MG capsule Take 300 mg by mouth 3 (three) times daily.    Marland Kitchen. glipiZIDE (GLUCOTROL XL) 10 MG 24 hr tablet Take 10 mg by mouth daily with breakfast.    . hydrochlorothiazide (HYDRODIURIL) 25 MG tablet Take 25 mg by mouth daily.    . hydroxychloroquine (PLAQUENIL) 200 MG tablet Take 200 mg by mouth 2 (two) times daily.    Marland Kitchen. lisinopril (PRINIVIL,ZESTRIL) 40 MG tablet Take 40 mg by mouth daily.    .Marland Kitchen  nitroGLYCERIN (NITROSTAT) 0.4 MG SL tablet Place 0.4 mg under the tongue every 5 (five) minutes as needed for chest pain.    Marland Kitchen. omeprazole (PRILOSEC) 20 MG capsule Take 20 mg by mouth daily.    . rosuvastatin (CRESTOR) 40 MG tablet Take 40 mg by mouth daily.    Marland Kitchen. triamcinolone cream (KENALOG) 0.1 % Apply 1 application topically 2 (two) times daily.    . isosorbide mononitrate (IMDUR) 30 MG 24 hr tablet Take 1 tablet (30 mg total) by mouth daily. 30 tablet 6   No current facility-administered medications for this visit.     Allergies:   Demerol [meperidine]; Dilaudid [hydromorphone hcl]; and Vicodin  [hydrocodone-acetaminophen]   Social History:  The patient  reports that she has never smoked. She has never used smokeless tobacco. She reports that she does not drink alcohol or use drugs.   Family History:  The patient's family history includes Breast cancer in her cousin and maternal aunt.  ROS:   Review of Systems  Constitutional: Positive for malaise/fatigue. Negative for chills, diaphoresis, fever and weight loss.  HENT: Negative for congestion.   Eyes: Negative for discharge and redness.  Respiratory: Positive for shortness of breath. Negative for cough, hemoptysis, sputum production and wheezing.   Cardiovascular: Positive for chest pain. Negative for palpitations, orthopnea, claudication, leg swelling and PND.  Gastrointestinal: Negative for abdominal pain, blood in stool, heartburn, melena, nausea and vomiting.  Genitourinary: Negative for hematuria.  Musculoskeletal: Negative for falls and myalgias.  Skin: Negative for rash.  Neurological: Positive for weakness. Negative for dizziness, tingling, tremors, sensory change, speech change, focal weakness and loss of consciousness.  Endo/Heme/Allergies: Does not bruise/bleed easily.  Psychiatric/Behavioral: Negative for substance abuse. The patient is nervous/anxious.   All other systems reviewed and are negative.    PHYSICAL EXAM:  VS:  BP 120/78 (BP Location: Left Arm, Patient Position: Sitting, Cuff Size: Normal)   Pulse 84   Ht 5\' 3"  (1.6 m)   Wt 155 lb (70.3 kg)   BMI 27.46 kg/m  BMI: Body mass index is 27.46 kg/m.  Physical Exam  Constitutional: She is oriented to person, place, and time. She appears well-developed and well-nourished.  HENT:  Head: Normocephalic and atraumatic.  Eyes: Right eye exhibits no discharge. Left eye exhibits no discharge.  Neck: Normal range of motion. No JVD present.  Cardiovascular: Normal rate, regular rhythm, S1 normal, S2 normal and normal heart sounds.  Exam reveals no distant heart  sounds, no friction rub, no midsystolic click and no opening snap.   No murmur heard. Pulmonary/Chest: Effort normal and breath sounds normal. No respiratory distress. She has no decreased breath sounds. She has no wheezes. She has no rales. She exhibits no tenderness.  Abdominal: Soft. She exhibits no distension. There is no tenderness.  Musculoskeletal: She exhibits no edema.  Neurological: She is alert and oriented to person, place, and time.  Skin: Skin is warm and dry. No cyanosis. Nails show no clubbing.  Psychiatric: She has a normal mood and affect. Her speech is normal and behavior is normal. Judgment and thought content normal.     EKG:  Was ordered and interpreted by me today. Shows NSR, 84 bpm, nonspecific st/t changes  Recent Labs: 10/25/2016: ALT 14; BUN 12; Creatinine, Ser 0.87; Hemoglobin 11.1; Platelets 254; Potassium 3.7; Sodium 142  10/25/2016: Cholesterol 130; HDL 51; LDL Cholesterol 50; Total CHOL/HDL Ratio 2.5; Triglycerides 146; VLDL 29   Estimated Creatinine Clearance: 70.4 mL/min (by C-G formula based on  SCr of 0.87 mg/dL).   Wt Readings from Last 3 Encounters:  11/10/16 155 lb (70.3 kg)  10/26/16 149 lb 11.2 oz (67.9 kg)  05/06/16 155 lb 13.8 oz (70.7 kg)     Other studies reviewed: Additional studies/records reviewed today include: summarized above  ASSESSMENT AND PLAN:  1. Unstable angina: Currently chest pain free. Schedule for LHC with Dr. Okey Dupre. Add Imdur 30 mg daily. Continue ASA 81 mg daily. Continue Coreg 6.25 mg bid. Risks and benefits of cardiac catheterization have been discussed with the patient including risks of bleeding, bruising, infection, kidney damage, stroke, heart attack, and death. The patient understands these risks and is willing to proceed with the procedure. All questions have been answered and concerns listened to.   2. HTN: Well controlled. Continue current medications.   3. HLD: Well controlled. Continue Crestor.   Disposition:  F/u with me s/p cardiac cath.  Current medicines are reviewed at length with the patient today.  The patient did not have any concerns regarding medicines.  Elinor Dodge PA-C 11/10/2016 3:25 PM     [facility] [place] [address] [place], [place] [address] [phone]

## 2016-11-18 NOTE — Discharge Instructions (Addendum)
Angiogram, Care After °These instructions give you information about caring for yourself after your procedure. Your doctor may also give you more specific instructions. Call your doctor if you have any problems or questions after your procedure. °Follow these instructions at home: °· Take medicines only as told by your doctor. °· Follow your doctor's instructions about: °¨ Care of the area where the tube was inserted. °¨ Bandage (dressing) changes and removal. °· You may shower 24-48 hours after the procedure or as told by your doctor. °· Do not take baths, swim, or use a hot tub until your doctor approves. °· Every day, check the area where the tube was inserted. Watch for: °¨ Redness, swelling, or pain. °¨ Fluid, blood, or pus. °· Do not apply powder or lotion to the site. °· Do not lift anything that is heavier than 10 lb (4.5 kg) for 5 days or as told by your doctor. °· Ask your doctor when you can: °¨ Return to work or school. °¨ Do physical activities or play sports. °¨ Have sex. °· Do not drive or operate heavy machinery for 24 hours or as told by your doctor. °· Have someone with you for the first 24 hours after the procedure. °· Keep all follow-up visits as told by your doctor. This is important. °Contact a health care provider if: °· You have a fever. °· You have chills. °· You have more bleeding from the area where the tube was inserted. Hold pressure on the area. °· You have redness, swelling, or pain in the area where the tube was inserted. °· You have fluid or pus coming from the area. °Get help right away if: °· You have a lot of pain in the area where the tube was inserted. °· The area where the tube was inserted is bleeding, and the bleeding does not stop after 30 minutes of holding steady pressure on the area. °· The area near or just beyond the insertion site becomes pale, cool, tingly, or numb. °This information is not intended to replace advice given to you by your health care provider. Make  sure you discuss any questions you have with your health care provider. °Document Released: 02/12/2009 Document Revised: 04/23/2016 Document Reviewed: 04/19/2013 °Elsevier Interactive Patient Education © 2017 [employer] ° °

## 2016-12-04 ENCOUNTER — Encounter: Payer: Self-pay | Admitting: Internal Medicine

## 2016-12-04 ENCOUNTER — Ambulatory Visit (INDEPENDENT_AMBULATORY_CARE_PROVIDER_SITE_OTHER): Payer: Self-pay | Admitting: Internal Medicine

## 2016-12-04 VITALS — BP 120/72 | HR 82 | Ht 63.0 in | Wt 154.5 lb

## 2016-12-04 DIAGNOSIS — E784 Other hyperlipidemia: Secondary | ICD-10-CM

## 2016-12-04 DIAGNOSIS — R079 Chest pain, unspecified: Secondary | ICD-10-CM

## 2016-12-04 DIAGNOSIS — E7849 Other hyperlipidemia: Secondary | ICD-10-CM

## 2016-12-04 DIAGNOSIS — I10 Essential (primary) hypertension: Secondary | ICD-10-CM

## 2016-12-04 NOTE — Progress Notes (Signed)
Follow-up Outpatient Visit Date: 12/04/2016  Chief Complaint: Follow-up chest pain  HPI:  [name] is a 56 y.o. year-old female with history of hypertension, diabetes mellitus, hyperlipidemia, DVT, peptic ulcer disease, stroke, fibromyalgia, and possible autoimmune disease, who presents for follow-up of chest pain. She has a long history of chronic atypical chest pain for which she has undergone at least 2 myocardial perfusion stress tests (most recently on 10/26/16). Though the study was low risk, the patient was ultimately referred for coronary angiography due to continued chest pain. Catheterization on 11/18/16 showed normal coronary arteries with mildly elevated left ventricular filling pressure in the setting of uncontrolled hypertension. At that time, carvedilol was increased. Today, the patient reports that the increased dose of beta blockade may of helped a little bit. She continues to have some intermittent left-sided chest pain but is most concerned about fatigue. She becomes easily exhausted and notes that her muscles are tired. She believes this may be related to fibromyalgia. She denies shortness of breath, palpitations, lightheadedness, and leg edema. She is currently applying for charity care at [facility] so that she can receive a CPAP machine for obstructive sleep apnea.  --------------------------------------------------------------------------------------------------  Cardiovascular History & Procedures: Cardiovascular Problems:  Chronic chest pain  Stroke  DVT  Risk Factors:  Hypertension, hyperlipidemia, diabetes mellitus  Cath/PCI:  LHC (11/18/16): No angiographically significant coronary artery disease. Mildly elevated left ventricular filling pressure (LVEDP 19 mmHg).  CV Surgery:  None  EP Procedures and Devices:  None  Non-Invasive Evaluation(s):  Pharmacologic myocardial perfusion stress test (10/26/16): Small in size, mild in severity, fixed defect at the  apex likely representing breast attenuation. Normal LV function. Overall low risk study.  Transthoracic echocardiogram (10/25/16): Normal LV size and function. LVEF 55-60% with normal diastolic parameters. Normal RV size and function. Mild tricuspid regurgitation.  Recent CV Pertinent Labs: Lab Results  Component Value Date   CHOL 130 10/25/2016   HDL 51 10/25/2016   LDLCALC 50 10/25/2016   TRIG 146 10/25/2016   CHOLHDL 2.5 10/25/2016   INR 0.99 11/11/2016   INR 0.9 09/27/2013   K 3.8 11/10/2016   K 3.8 10/04/2013   BUN 13 11/10/2016   BUN 21 (H) 10/04/2013   CREATININE 0.67 11/10/2016   CREATININE 0.91 10/04/2013    Past medical and surgical history were reviewed and updated in EPIC.   Outpatient Encounter Prescriptions as of 12/04/2016  Medication Sig  . amLODipine (NORVASC) 10 MG tablet Take 10 mg by mouth daily.  Marland Kitchen aspirin EC 81 MG tablet Take 81 mg by mouth daily.  . Biotin 1000 MCG tablet Take 1,000 mcg by mouth daily.  . carvedilol (COREG) 25 MG tablet Take 1 tablet (25 mg total) by mouth 2 (two) times daily with a meal.  . clobetasol cream (TEMOVATE) 0.05 % Apply 1 application topically 2 (two) times daily.  . DULoxetine (CYMBALTA) 30 MG capsule Take 30 mg by mouth daily.  Marland Kitchen gabapentin (NEURONTIN) 300 MG capsule Take 300 mg by mouth 3 (three) times daily.  Marland Kitchen glipiZIDE (GLUCOTROL XL) 10 MG 24 hr tablet Take 10 mg by mouth daily with breakfast.  . hydrochlorothiazide (HYDRODIURIL) 25 MG tablet Take 25 mg by mouth daily.  . hydroxychloroquine (PLAQUENIL) 200 MG tablet Take 200 mg by mouth 2 (two) times daily.  . isosorbide mononitrate (IMDUR) 30 MG 24 hr tablet Take 1 tablet (30 mg total) by mouth daily.  Marland Kitchen lisinopril (PRINIVIL,ZESTRIL) 40 MG tablet Take 40 mg by mouth daily.  Marland Kitchen  nitroGLYCERIN (NITROSTAT) 0.4 MG SL tablet Place 0.4 mg under the tongue every 5 (five) minutes as needed for chest pain.  Marland Kitchen omeprazole (PRILOSEC) 20 MG capsule Take 20 mg by mouth daily.  .  rosuvastatin (CRESTOR) 40 MG tablet Take 40 mg by mouth at bedtime.   . triamcinolone cream (KENALOG) 0.1 % Apply 1 application topically 2 (two) times daily.  . Vitamin D, Ergocalciferol, (DRISDOL) 50000 units CAPS capsule Take 50,000 Units by mouth every 7 (seven) days.   No facility-administered encounter medications on file as of 12/04/2016.     Allergies: Demerol [meperidine]; Dilaudid [hydromorphone hcl]; and Vicodin [hydrocodone-acetaminophen]  Social History   Social History  . Marital status: Divorced    Spouse name: N/A  . Number of children: N/A  . Years of education: N/A   Occupational History  . Not on file.   Social History Main Topics  . Smoking status: Never Smoker  . Smokeless tobacco: Never Used  . Alcohol use No  . Drug use: No  . Sexual activity: Not on file   Other Topics Concern  . Not on file   Social History Narrative  . No narrative on file    Family History  Problem Relation Age of Onset  . Breast cancer Maternal Aunt     50's  . Breast cancer Cousin     50's    Review of Systems: Review of Systems  Constitutional: Positive for malaise/fatigue. Negative for fever.  HENT: Negative.   Respiratory: Negative.   Cardiovascular: Positive for chest pain (see HPI).  Gastrointestinal: Negative.   Musculoskeletal: Positive for myalgias.  Neurological: Negative.    --------------------------------------------------------------------------------------------------  Physical Exam: BP 120/72 (BP Location: Left Arm, Patient Position: Sitting, Cuff Size: Normal)   Pulse 82   Ht 5\' 3"  (1.6 m)   Wt 154 lb 8 oz (70.1 kg)   BMI 27.37 kg/m   General:  Overweight woman, seated comfortably in the exam room. HEENT: No conjunctival pallor or scleral icterus.  Moist mucous membranes.  OP clear. Neck: Supple without lymphadenopathy, thyromegaly, JVD, or HJR.  No carotid bruit. Lungs: Normal work of breathing.  Clear to auscultation bilaterally without  wheezes or crackles. Heart: Regular rate and rhythm without murmurs, rubs, or gallops.  Non-displaced PMI. Abd: Bowel sounds present.  Soft, NT/ND without hepatosplenomegaly Ext: No lower extremity edema.  Radial, PT, and DP pulses are 2+ bilaterally. Right radial arteriotomy site is well-healed. Skin: warm and dry without rash  EKG:  Normal sinus rhythm with nonspecific T-wave changes. No significant change from prior tracing on 11/10/16 (I have personally reviewed both tracings).  Lab Results  Component Value Date   WBC 5.4 11/11/2016   HGB 11.1 (L) 11/11/2016   HCT 32.2 (L) 11/11/2016   MCV 78.5 (L) 11/11/2016   PLT 282 11/11/2016    Lab Results  Component Value Date   NA 141 11/10/2016   K 3.8 11/10/2016   CL 100 11/10/2016   CO2 27 11/10/2016   BUN 13 11/10/2016   CREATININE 0.67 11/10/2016   GLUCOSE 81 11/10/2016   ALT 14 10/25/2016    Lab Results  Component Value Date   CHOL 130 10/25/2016   HDL 51 10/25/2016   LDLCALC 50 10/25/2016   TRIG 146 10/25/2016   CHOLHDL 2.5 10/25/2016    --------------------------------------------------------------------------------------------------  ASSESSMENT AND PLAN: Noncardiac chest pain Chest pain has been a chronic problem for the patient. Workup including stress tests at [facility] and [facility] has been low risk.  Additionally, cardiac catheterization last month showed no significant CAD. Given the patient's history of fibromyalgia and other potential autoimmune disorder, I suggest further evaluation and treatment of these entities, as well as other possible noncardiac causes of her chronic pain. The patient can remain on isosorbide mononitrate and carvedilol for possible element of vasospasm and/or microvascular dysfunction.  Hypertension Blood pressure well controlled today. Continue current medications.  Hyperlipidemia Patient history of diabetes mellitus, she should remain on high intensity statin therapy, as  tolerated.  Follow-up: Return to clinic as needed.  Yvonne Kendallhristopher Philisha Weinel, MD 12/05/2016 2:36 PM

## 2016-12-04 NOTE — Patient Instructions (Signed)
Medication Instructions:  Your physician recommends that you continue on your current medications as directed. Please refer to the Current Medication list given to you today.   Labwork: none  Testing/Procedures: none  Follow-Up: Your physician recommends that you schedule a follow-up appointment as needed.    Any Other Special Instructions Will Be Listed Below (If Applicable).     If you need a refill on your cardiac medications before your next appointment, please call your pharmacy.   

## 2016-12-05 ENCOUNTER — Encounter: Payer: Self-pay | Admitting: Internal Medicine

## 2017-01-17 ENCOUNTER — Encounter: Payer: Self-pay | Admitting: Medical Oncology

## 2017-01-17 ENCOUNTER — Emergency Department: Payer: Medicaid Other

## 2017-01-17 ENCOUNTER — Emergency Department
Admission: EM | Admit: 2017-01-17 | Discharge: 2017-01-17 | Disposition: A | Discharge status: Home / Self Care | Payer: Medicaid Other | Attending: Student in an Organized Health Care Education/Training Program | Admitting: Student in an Organized Health Care Education/Training Program

## 2017-01-17 DIAGNOSIS — M79645 Pain in left finger(s): Secondary | ICD-10-CM | POA: Diagnosis present

## 2017-01-17 DIAGNOSIS — Z79899 Other long term (current) drug therapy: Secondary | ICD-10-CM | POA: Insufficient documentation

## 2017-01-17 DIAGNOSIS — I10 Essential (primary) hypertension: Secondary | ICD-10-CM | POA: Diagnosis not present

## 2017-01-17 DIAGNOSIS — Z7982 Long term (current) use of aspirin: Secondary | ICD-10-CM | POA: Diagnosis not present

## 2017-01-17 DIAGNOSIS — Z8781 Personal history of (healed) traumatic fracture: Secondary | ICD-10-CM | POA: Diagnosis not present

## 2017-01-17 DIAGNOSIS — E119 Type 2 diabetes mellitus without complications: Secondary | ICD-10-CM | POA: Insufficient documentation

## 2017-01-17 DIAGNOSIS — Z87828 Personal history of other (healed) physical injury and trauma: Secondary | ICD-10-CM

## 2017-01-17 DIAGNOSIS — M19042 Primary osteoarthritis, left hand: Secondary | ICD-10-CM | POA: Insufficient documentation

## 2017-01-17 DIAGNOSIS — Z7984 Long term (current) use of oral hypoglycemic drugs: Secondary | ICD-10-CM | POA: Diagnosis not present

## 2017-01-17 NOTE — ED Triage Notes (Signed)
Pt reports that she closed her left hand in her car door in October and since then has been having pain to hand and wrist.

## 2017-01-17 NOTE — Discharge Instructions (Signed)
Take your home meds as directed. Wear the thumb splint for the next week. Follow-up with your provider or Dr. Martha ClanKrasinski for continued symptoms. Apply ice to reduce pain.

## 2017-01-17 NOTE — ED Notes (Signed)
Pt presents to ED with c/o L wrist pain from an accident in October. Pt states a car door slammed on her wrist, pt also states that she doesn't like doctors and that's why she waited so long. Pt maintains sensation, movement, cap refill < 3 seconds, pulses intact.
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
## 2017-01-17 NOTE — ED Notes (Signed)
NAD noted at time of D/C. Pt denies questions or concerns. Pt ambulatory to the lobby at this time.  

## 2017-03-04 ENCOUNTER — Encounter: Payer: Self-pay | Admitting: *Deleted

## 2017-03-04 ENCOUNTER — Emergency Department
Admission: EM | Admit: 2017-03-04 | Discharge: 2017-03-04 | Disposition: A | Discharge status: Home / Self Care | Payer: Medicaid Other | Attending: Emergency Medicine | Admitting: Emergency Medicine

## 2017-03-04 ENCOUNTER — Emergency Department: Payer: Medicaid Other

## 2017-03-04 DIAGNOSIS — K279 Peptic ulcer, site unspecified, unspecified as acute or chronic, without hemorrhage or perforation: Secondary | ICD-10-CM

## 2017-03-04 DIAGNOSIS — Z7984 Long term (current) use of oral hypoglycemic drugs: Secondary | ICD-10-CM | POA: Insufficient documentation

## 2017-03-04 DIAGNOSIS — I10 Essential (primary) hypertension: Secondary | ICD-10-CM | POA: Diagnosis not present

## 2017-03-04 DIAGNOSIS — E119 Type 2 diabetes mellitus without complications: Secondary | ICD-10-CM | POA: Insufficient documentation

## 2017-03-04 DIAGNOSIS — Z7982 Long term (current) use of aspirin: Secondary | ICD-10-CM | POA: Diagnosis not present

## 2017-03-04 DIAGNOSIS — R1010 Upper abdominal pain, unspecified: Secondary | ICD-10-CM | POA: Diagnosis present

## 2017-03-04 DIAGNOSIS — Z79899 Other long term (current) drug therapy: Secondary | ICD-10-CM | POA: Insufficient documentation

## 2017-03-04 LAB — CBC
HEMATOCRIT: 36.7 % (ref 35.0–47.0)
HEMOGLOBIN: 12.2 g/dL (ref 12.0–16.0)
MCH: 25.9 pg — AB (ref 26.0–34.0)
MCHC: 33.2 g/dL (ref 32.0–36.0)
MCV: 77.9 fL — ABNORMAL LOW (ref 80.0–100.0)
Platelets: 285 10*3/uL (ref 150–440)
RBC: 4.71 MIL/uL (ref 3.80–5.20)
RDW: 15.2 % — AB (ref 11.5–14.5)
WBC: 5.8 10*3/uL (ref 3.6–11.0)

## 2017-03-04 LAB — COMPREHENSIVE METABOLIC PANEL
ALBUMIN: 4.7 g/dL (ref 3.5–5.0)
ALK PHOS: 84 U/L (ref 38–126)
ALT: 12 U/L — ABNORMAL LOW (ref 14–54)
ANION GAP: 8 (ref 5–15)
AST: 17 U/L (ref 15–41)
BILIRUBIN TOTAL: 0.7 mg/dL (ref 0.3–1.2)
BUN: 16 mg/dL (ref 6–20)
CO2: 27 mmol/L (ref 22–32)
Calcium: 9.6 mg/dL (ref 8.9–10.3)
Chloride: 103 mmol/L (ref 101–111)
Creatinine, Ser: 0.51 mg/dL (ref 0.44–1.00)
GFR calc Af Amer: >60 mL/min (ref >60)
GFR calc non Af Amer: >60 mL/min (ref >60)
GLUCOSE: 86 mg/dL (ref 65–99)
Potassium: 3.2 mmol/L — ABNORMAL LOW (ref 3.5–5.1)
SODIUM: 138 mmol/L (ref 135–145)
TOTAL PROTEIN: 8.6 g/dL — AB (ref 6.5–8.1)

## 2017-03-04 LAB — URINALYSIS, COMPLETE (UACMP) WITH MICROSCOPIC
BACTERIA UA: NONE SEEN
Bilirubin Urine: NEGATIVE
Glucose, UA: NEGATIVE mg/dL
Hgb urine dipstick: NEGATIVE
Ketones, ur: NEGATIVE mg/dL
Leukocytes, UA: NEGATIVE
Nitrite: NEGATIVE
PH: 7 (ref 5.0–8.0)
Protein, ur: NEGATIVE mg/dL
SPECIFIC GRAVITY, URINE: 1.011 (ref 1.005–1.030)

## 2017-03-04 LAB — PREGNANCY, URINE: PREG TEST UR: NEGATIVE

## 2017-03-04 LAB — LIPASE, BLOOD: Lipase: 15 U/L (ref 11–51)

## 2017-03-04 MED ORDER — SUCRALFATE 1 G PO TABS: 1.0000 g | ORAL_TABLET | Freq: Four times a day (QID) | ORAL | 1 refills | Status: DC

## 2017-03-04 MED ORDER — KETOROLAC TROMETHAMINE 60 MG/2ML IM SOLN
60.0000 mg | Freq: Once | INTRAMUSCULAR | Status: AC
  Administered 2017-03-04: 60 mg via INTRAMUSCULAR
  Filled 2017-03-04: qty 2

## 2017-03-04 MED ORDER — GI COCKTAIL ~~LOC~~
30.0000 mL | Freq: Once | ORAL | Status: AC
  Administered 2017-03-04: 30 mL via ORAL
  Filled 2017-03-04: qty 30

## 2017-03-04 MED ORDER — OXYCODONE-ACETAMINOPHEN 5-325 MG PO TABS
2.0000 | ORAL_TABLET | Freq: Once | ORAL | Status: DC
  Filled 2017-03-04: qty 2

## 2017-03-04 MED ORDER — ONDANSETRON 4 MG PO TBDP
4.0000 mg | ORAL_TABLET | Freq: Once | ORAL | Status: AC
  Administered 2017-03-04: 4 mg via ORAL
  Filled 2017-03-04: qty 1

## 2017-03-04 MED ORDER — TRAMADOL HCL 50 MG PO TABS: 50.0000 mg | ORAL_TABLET | Freq: Four times a day (QID) | ORAL | 0 refills | Status: AC | PRN

## 2017-03-04 MED ORDER — OMEPRAZOLE 20 MG PO CPDR: 20.0000 mg | DELAYED_RELEASE_CAPSULE | Freq: Two times a day (BID) | ORAL | 2 refills | Status: DC

## 2017-03-04 NOTE — ED Triage Notes (Signed)
States upper abd pain that began this AM, states nausea, states several BM today, awake and alert in no acute distress

## 2017-03-04 NOTE — ED Provider Notes (Signed)
[facility] Emergency Department Provider Note       Time seen: ----------------------------------------- 5:51 PM on 03/04/2017 -----------------------------------------     I have reviewed the triage vital signs and the nursing notes.   HISTORY   Chief Complaint Abdominal Pain    HPI Mary Cuevas is a 56 y.o. female who presents to the ED for upper abdominal pain that began this morning, she has had nausea and several normal bowel movement was today. She does not drink or smoke, has severe pain that is 10 out of 10. She does have a history of stomach ulcers. She denies fevers, chills or other complaints.   Past Medical History:  Diagnosis Date  . Chest pain   . Diabetes mellitus without complication (HCC)   . DVT (deep vein thrombosis) in pregnancy (HCC)   . Ectopic pregnancy   . Hypertension   . Stroke (cerebrum) [facility])     Patient Active Problem List   Diagnosis Date Noted  . Unstable angina (HCC) 10/25/2016  . Chest pain 10/25/2016  . Essential hypertension   . Other hyperlipidemia     Past Surgical History:  Procedure Laterality Date  . ABDOMINAL HYSTERECTOMY    . CARDIAC CATHETERIZATION N/A 11/18/2016   Procedure: Left Heart Cath and Coronary Angiography;  Surgeon: Yvonne Kendall, MD;  Location: [facility] INVASIVE CV LAB;  Service: Cardiovascular;  Laterality: N/A;  . KNEE ARTHROSCOPY      Allergies Demerol [meperidine]; Dilaudid [hydromorphone hcl]; and Vicodin [hydrocodone-acetaminophen]  Social History Social History  Substance Use Topics  . Smoking status: Never Smoker  . Smokeless tobacco: Never Used  . Alcohol use No    Review of Systems Constitutional: Negative for fever. Cardiovascular: Negative for chest pain. Respiratory: Negative for shortness of breath. Gastrointestinal: Positive for abdominal pain with nausea Genitourinary: Negative for dysuria. Musculoskeletal: Negative for back pain. Skin: Negative for  rash. Neurological: Negative for headaches, focal weakness or numbness.  10-point ROS otherwise negative.  ____________________________________________   PHYSICAL EXAM:  VITAL SIGNS: ED Triage Vitals  Enc Vitals Group     BP 03/04/17 1510 (!) 165/82     Pulse Rate 03/04/17 1510 (!) 101     Resp 03/04/17 1510 18     Temp 03/04/17 1510 99.8 F (37.7 C)     Temp Source 03/04/17 1510 Oral     SpO2 03/04/17 1510 98 %     Weight 03/04/17 1509 154 lb (69.9 kg)     Height 03/04/17 1509  (1.6 m)     Head Circumference --      Peak Flow --      Pain Score 03/04/17 1509 10     Pain Loc --      Pain Edu? --      Excl. in GC? --     Constitutional: Alert and oriented. Well appearing and in no distress. Eyes: Conjunctivae are normal. Normal extraocular movements. ENT   Head: Normocephalic and atraumatic.   Nose: No congestion/rhinnorhea.   Mouth/Throat: Mucous membranes are moist.   Neck: No stridor. Cardiovascular: Normal rate, regular rhythm. No murmurs, rubs, or gallops. Respiratory: Normal respiratory effort without tachypnea nor retractions. Breath sounds are clear and equal bilaterally. No wheezes/rales/rhonchi. Gastrointestinal: Epigastric tenderness, no rebound or guarding. Normal bowel sounds. Musculoskeletal: Nontender with normal range of motion in extremities. No lower extremity tenderness nor edema. Neurologic:  Normal speech and language. No gross focal neurologic deficits are appreciated.  Skin:  Skin is warm, dry and intact.  No rash noted. Psychiatric: Mood and affect are normal. Speech and behavior are normal.  ____________________________________________  ED COURSE:  Pertinent labs & imaging results that were available during my care of the patient were reviewed by me and considered in my medical decision making (see chart for details). Patient presents for abdominal pain, we will assess with labs and imaging as indicated.    Procedures ____________________________________________   LABS (pertinent positives/negatives)  Labs Reviewed  COMPREHENSIVE METABOLIC PANEL - Abnormal; Notable for the following:       Result Value   Potassium 3.2 (*)    Total Protein 8.6 (*)    ALT 12 (*)    All other components within normal limits  CBC - Abnormal; Notable for the following:    MCV 77.9 (*)    MCH 25.9 (*)    RDW 15.2 (*)    All other components within normal limits  URINALYSIS, COMPLETE (UACMP) WITH MICROSCOPIC - Abnormal; Notable for the following:    Color, Urine STRAW (*)    APPearance CLEAR (*)    Squamous Epithelial / LPF 0-5 (*)    All other components within normal limits  LIPASE, BLOOD  PREGNANCY, URINE    RADIOLOGY Images were viewed by me  Abdomen 2 view IMPRESSION: Negative. ____________________________________________  FINAL ASSESSMENT AND PLAN  Abdominal pain, possible peptic ulcer disease  Plan: Patient's labs and imaging were dictated above. Patient had presented for Epigastric pain. We will increase her antacid as well as add Carafate and pain medication. She'll be referred to GI for outpatient follow-up. The remainder of her workup has been negative.   Emily Filbert, MD   Note: This note was generated in part or whole with voice recognition software. Voice recognition is usually quite accurate but there are transcription errors that can and very often do occur. I apologize for any typographical errors that were not detected and corrected.     Emily Filbert, MD 03/04/17 [phone]

## 2017-03-04 NOTE — ED Notes (Signed)
Lab called and Liborio Nixon will add on pregnancy urine to urine already in lab

## 2017-05-12 ENCOUNTER — Ambulatory Visit
Admission: RE | Admit: 2017-05-12 | Discharge: 2017-05-12 | Disposition: A | Discharge status: Home / Self Care | Payer: Medicaid Other | Source: Ambulatory Visit | Attending: Oncology | Admitting: Oncology

## 2017-05-12 ENCOUNTER — Ambulatory Visit: Payer: Medicaid Other | Attending: Oncology

## 2017-05-12 VITALS — BP 96/62 | HR 72 | Temp 97.5°F | Ht 65.0 in | Wt 149.0 lb

## 2017-05-12 DIAGNOSIS — Z0000 Encounter for general adult medical examination without abnormal findings: Secondary | ICD-10-CM

## 2017-05-12 NOTE — Progress Notes (Signed)
Subjective:     Patient ID: Damaris HippoKatrina F Wands, female   DOB: 07-28-1961, 56 y.o.   MRN: [id number]  HPI   Review of Systems     Objective:   Physical Exam  Pulmonary/Chest: Right breast exhibits no inverted nipple, no mass, no nipple discharge, no skin change and no tenderness. Left breast exhibits no inverted nipple, no mass, no nipple discharge, no skin change and no tenderness. Breasts are symmetrical.       Assessment:     56 [facility].  Patient screened, and meets [facility] eligibility.  Patient does not have insurance, Medicare or Medicaid.  Handout given on Affordable Care Act.  Instructed patient on breast self-exam using teach back method.  CBE unremarkable.   No mass or lump palpated,  Patient has a daughter, and a son.  Son is getting married June 30,2018.    Plan:     Sent for bilateral screening mammogram.

## 2017-05-13 NOTE — Progress Notes (Signed)
Letter mailed from [facility] to notify of normal mammogram results.  Patient to return in one year for annual screening.  Copy to [facility]. 

## 2017-12-24 ENCOUNTER — Ambulatory Visit: Admission: RE | Admit: 2017-12-24 | Payer: Medicaid Other | Source: Ambulatory Visit | Admitting: Gastroenterology

## 2017-12-24 ENCOUNTER — Encounter: Admission: RE | Payer: Self-pay | Source: Ambulatory Visit

## 2017-12-24 SURGERY — COLONOSCOPY WITH PROPOFOL
Anesthesia: General

## 2018-02-22 ENCOUNTER — Other Ambulatory Visit: Payer: Self-pay

## 2018-02-22 ENCOUNTER — Encounter: Payer: Self-pay | Admitting: Emergency Medicine

## 2018-02-22 ENCOUNTER — Emergency Department: Payer: Medicare Other

## 2018-02-22 DIAGNOSIS — Z7982 Long term (current) use of aspirin: Secondary | ICD-10-CM | POA: Diagnosis not present

## 2018-02-22 DIAGNOSIS — I10 Essential (primary) hypertension: Secondary | ICD-10-CM | POA: Diagnosis not present

## 2018-02-22 DIAGNOSIS — R079 Chest pain, unspecified: Secondary | ICD-10-CM | POA: Insufficient documentation

## 2018-02-22 DIAGNOSIS — E119 Type 2 diabetes mellitus without complications: Secondary | ICD-10-CM | POA: Insufficient documentation

## 2018-02-22 DIAGNOSIS — Z8673 Personal history of transient ischemic attack (TIA), and cerebral infarction without residual deficits: Secondary | ICD-10-CM | POA: Diagnosis not present

## 2018-02-22 DIAGNOSIS — Z79899 Other long term (current) drug therapy: Secondary | ICD-10-CM | POA: Insufficient documentation

## 2018-02-22 LAB — CBC
HEMATOCRIT: 35.6 % (ref 35.0–47.0)
Hemoglobin: 12.1 g/dL (ref 12.0–16.0)
MCH: 26.9 pg (ref 26.0–34.0)
MCHC: 34.1 g/dL (ref 32.0–36.0)
MCV: 78.9 fL — AB (ref 80.0–100.0)
PLATELETS: 290 10*3/uL (ref 150–440)
RBC: 4.51 MIL/uL (ref 3.80–5.20)
RDW: 14.5 % (ref 11.5–14.5)
WBC: 5.9 10*3/uL (ref 3.6–11.0)

## 2018-02-22 NOTE — ED Triage Notes (Signed)
Patient ambulatory to triage with steady gait, without difficulty or distress noted; pt reports tonight having pain beneath left breast radiating into neck with no accomp symptoms; st hx of same with normal cath

## 2018-02-23 ENCOUNTER — Emergency Department: Payer: Medicare Other

## 2018-02-23 ENCOUNTER — Emergency Department
Admission: EM | Admit: 2018-02-23 | Discharge: 2018-02-23 | Disposition: A | Discharge status: Home / Self Care | Payer: Medicare Other | Attending: Emergency Medicine | Admitting: Emergency Medicine

## 2018-02-23 DIAGNOSIS — R079 Chest pain, unspecified: Secondary | ICD-10-CM

## 2018-02-23 LAB — TROPONIN I

## 2018-02-23 LAB — BASIC METABOLIC PANEL
ANION GAP: 11 (ref 5–15)
BUN: 11 mg/dL (ref 6–20)
CHLORIDE: 104 mmol/L (ref 101–111)
CO2: 26 mmol/L (ref 22–32)
Calcium: 9.1 mg/dL (ref 8.9–10.3)
Creatinine, Ser: 0.9 mg/dL (ref 0.44–1.00)
GFR calc Af Amer: >60 mL/min (ref >60)
Glucose, Bld: 134 mg/dL — ABNORMAL HIGH (ref 65–99)
Potassium: 3.4 mmol/L — ABNORMAL LOW (ref 3.5–5.1)
SODIUM: 141 mmol/L (ref 135–145)

## 2018-02-23 MED ORDER — IBUPROFEN 600 MG PO TABS: 600.0000 mg | ORAL_TABLET | Freq: Three times a day (TID) | ORAL | 0 refills | Status: DC | PRN

## 2018-02-23 MED ORDER — IOPAMIDOL (ISOVUE-370) INJECTION 76%
125.0000 mL | Freq: Once | INTRAVENOUS | Status: AC | PRN
  Administered 2018-02-23: 125 mL via INTRAVENOUS

## 2018-02-23 MED ORDER — FENTANYL CITRATE (PF) 100 MCG/2ML IJ SOLN
50.0000 ug | Freq: Once | INTRAMUSCULAR | Status: AC
  Administered 2018-02-23: 50 ug via INTRAVENOUS
  Filled 2018-02-23: qty 2

## 2018-02-23 NOTE — ED Provider Notes (Signed)
[facility] Emergency Department Provider Note  ____________________________________________   First MD Initiated Contact with Patient 02/23/18 [phone]     (approximate)  I have reviewed the triage vital signs and the nursing notes.   HISTORY  Chief Complaint Chest Pain   HPI Mary Cuevas is a 57 y.o. female who self presents to the emergency department with sudden onset severe left chest pain.  The pain began under her left breast radiating to her left neck.  The pain is been constant.  Came on suddenly and has been slowly progressive.  Nothing seems to make it better or worse.  Nonexertional.  No shortness of breath.  She had a cardiac catheterization in 2017 showing no significant coronary artery disease.  She has no history of DVT or pulmonary embolism.  No recent surgery travel or immobilization.  Past Medical History:  Diagnosis Date  . Chest pain   . Diabetes mellitus without complication (HCC)   . DVT (deep vein thrombosis) in pregnancy (HCC)   . Ectopic pregnancy   . Hypertension   . Stroke (cerebrum) [facility])     Patient Active Problem List   Diagnosis Date Noted  . Unstable angina (HCC) 10/25/2016  . Chest pain 10/25/2016  . Essential hypertension   . Other hyperlipidemia     Past Surgical History:  Procedure Laterality Date  . ABDOMINAL HYSTERECTOMY    . CARDIAC CATHETERIZATION N/A 11/18/2016   Procedure: Left Heart Cath and Coronary Angiography;  Surgeon: Yvonne Kendall, MD;  Location: [facility] INVASIVE CV LAB;  Service: Cardiovascular;  Laterality: N/A;  . KNEE ARTHROSCOPY      Prior to Admission medications   Medication Sig Start Date End Date Taking? Authorizing Provider  amLODipine (NORVASC) 10 MG tablet Take 10 mg by mouth daily.    [provider]  aspirin EC 81 MG tablet Take 81 mg by mouth daily.    [provider]  Biotin 1000 MCG tablet Take 1,000 mcg by mouth daily.    [provider]    carvedilol (COREG) 25 MG tablet Take 1 tablet (25 mg total) by mouth 2 (two) times daily with a meal. 11/18/16   End, Cristal Deer, MD  clobetasol cream (TEMOVATE) 0.05 % Apply 1 application topically 2 (two) times daily.    [provider]  DULoxetine (CYMBALTA) 30 MG capsule Take 30 mg by mouth daily.    [provider]  gabapentin (NEURONTIN) 300 MG capsule Take 300 mg by mouth 3 (three) times daily.    [provider]  glipiZIDE (GLUCOTROL XL) 10 MG 24 hr tablet Take 10 mg by mouth daily with breakfast.    [provider]  hydrochlorothiazide (HYDRODIURIL) 25 MG tablet Take 25 mg by mouth daily.    [provider]  hydroxychloroquine (PLAQUENIL) 200 MG tablet Take 200 mg by mouth 2 (two) times daily.    [provider]  ibuprofen (ADVIL,MOTRIN) 600 MG tablet Take 1 tablet (600 mg total) by mouth every 8 (eight) hours as needed. 02/23/18   Merrily Brittle, MD  isosorbide mononitrate (IMDUR) 30 MG 24 hr tablet Take 1 tablet (30 mg total) by mouth daily. 11/10/16 02/08/17  Sondra Barges, PA-C  lisinopril (PRINIVIL,ZESTRIL) 40 MG tablet Take 40 mg by mouth daily.    [provider]  nitroGLYCERIN (NITROSTAT) 0.4 MG SL tablet Place 0.4 mg under the tongue every 5 (five) minutes as needed for chest pain.    [provider]  omeprazole (PRILOSEC) 20  MG capsule Take 1 capsule (20 mg total) by mouth 2 (two) times daily before a meal. 03/04/17   Emily FilbertWilliams, Jonathan E, MD  rosuvastatin (CRESTOR) 40 MG tablet Take 40 mg by mouth at bedtime.     [provider]  sucralfate (CARAFATE) 1 g tablet Take 1 tablet (1 g total) by mouth 4 (four) times daily. 03/04/17 03/04/18  Emily FilbertWilliams, Jonathan E, MD  traMADol (ULTRAM) 50 MG tablet Take 1 tablet (50 mg total) by mouth every 6 (six) hours as needed. 03/04/17 03/04/18  Emily FilbertWilliams, Jonathan E, MD  triamcinolone cream (KENALOG) 0.1 % Apply 1 application topically 2 (two) times daily.    [provider]  Vitamin D, Ergocalciferol, (DRISDOL) 50000 units CAPS capsule Take 50,000 Units by mouth every 7 (seven) days.    [provider]    Allergies Demerol [meperidine]; Vicodin [hydrocodone-acetaminophen]; and Dilaudid [hydromorphone hcl]  Family History  Problem Relation Age of Onset  . Breast cancer Maternal Aunt        50's  . Breast cancer Cousin        5650's    Social History Social History   Tobacco Use  . Smoking status: Never Smoker  . Smokeless tobacco: Never Used  Substance Use Topics  . Alcohol use: No  . Drug use: No    Review of Systems Constitutional: No fever/chills Eyes: No visual changes. ENT: No sore throat. Cardiovascular: Positive for chest pain. Respiratory: Denies shortness of breath. Gastrointestinal: No abdominal pain.  No nausea, no vomiting.  No diarrhea.  No constipation. Genitourinary: Negative for dysuria. Musculoskeletal: Negative for back pain. Skin: Negative for rash. Neurological: Negative for headaches, focal weakness or numbness.   ____________________________________________   PHYSICAL EXAM:  VITAL SIGNS: ED Triage Vitals  Enc Vitals Group     BP 02/22/18 2315 (!) 157/86     Pulse Rate 02/22/18 2315 86     Resp 02/22/18 2315 18     Temp 02/22/18 2315 97.9 F (36.6 C)     Temp Source 02/22/18 2315 Oral     SpO2 02/22/18 2315 100 %     Weight 02/22/18 2312 150 lb (68 kg)     Height 02/22/18 2312 5\' 3"  (1.6 m)     Head Circumference --      Peak Flow --      Pain Score 02/22/18 2311 8     Pain Loc --      Pain Edu? --      Excl. in GC? --     Constitutional: Alert and oriented x4 appears somewhat uncomfortable nontoxic no diaphoresis speaks full clear sentences Eyes: PERRL EOMI. Head: Atraumatic. Nose: No congestion/rhinnorhea. Mouth/Throat: No trismus Neck: No stridor.   Cardiovascular: Normal rate, regular rhythm. Grossly normal heart sounds.  Good peripheral circulation. Respiratory: Normal  respiratory effort.  No retractions. Lungs CTAB and moving good air Gastrointestinal: Soft nontender Musculoskeletal: No lower extremity edema   Neurologic:  Normal speech and language. No gross focal neurologic deficits are appreciated. Skin:  Skin is warm, dry and intact. No rash noted. Psychiatric: Mood and affect are normal. Speech and behavior are normal.    ____________________________________________   DIFFERENTIAL includes but not limited to  Aortic dissection, acute coronary syndrome, pulmonary embolism, pneumothorax ____________________________________________   LABS (all labs ordered are listed, but only abnormal results are displayed)  Labs Reviewed  BASIC METABOLIC PANEL - Abnormal; Notable for the following components:      Result Value   Potassium 3.4 (*)  Glucose, Bld 134 (*)    All other components within normal limits  CBC - Abnormal; Notable for the following components:   MCV 78.9 (*)    All other components within normal limits  TROPONIN I    Lab work reviewed by me with no acute disease __________________________________________  EKG  ED ECG REPORT I, Merrily Brittle, the attending physician, personally viewed and interpreted this ECG.  Date: 02/23/2018 EKG Time:  Rate: 83 Rhythm: normal sinus rhythm QRS Axis: normal Intervals: normal ST/T Wave abnormalities: normal Narrative Interpretation: no evidence of acute ischemia  ____________________________________________  RADIOLOGY  CT angiogram reviewed by me with no acute disease ____________________________________________   PROCEDURES  Procedure(s) performed: no  Procedures  Critical Care performed: no  Observation: no ____________________________________________   INITIAL IMPRESSION / ASSESSMENT AND PLAN / ED COURSE  Pertinent labs & imaging results that were available during my care of the patient were reviewed by me and considered in my medical decision making (see chart  for details).  The patient's history is more consistent with aortic dissection and acute coronary syndrome.  Sudden onset left chest pain radiating up her back and neck.  CT angiogram obtained which fortunately is negative for acute vascular disease.  The patient's symptoms are resolved.  EKG is nonischemic troponin is negative.  Unclear etiology of the patient's symptoms but it very well may be musculoskeletal.  I will refer her back to primary care.  Strict return precautions have been given and the patient verbalizes understanding and agreement with the plan.      ____________________________________________   FINAL CLINICAL IMPRESSION(S) / ED DIAGNOSES  Final diagnoses:  Chest pain, unspecified type      NEW MEDICATIONS STARTED DURING THIS VISIT:  Discharge Medication List as of 02/23/2018  5:26 AM    START taking these medications   Details  ibuprofen (ADVIL,MOTRIN) 600 MG tablet Take 1 tablet (600 mg total) by mouth every 8 (eight) hours as needed., Starting Wed 02/23/2018, Print         Note:  This document was prepared using Dragon voice recognition software and may include unintentional dictation errors.     Merrily Brittle, MD 02/25/18 [phone]

## 2018-02-23 NOTE — Discharge Instructions (Signed)
Fortunately today your EKG, your blood work, and your CT scan were reassuring.  Please take your pain medication as needed for severe symptoms and follow-up with primary care in 2 days for recheck.  Return to the emergency department sooner for any concerns whatsoever.  It was a pleasure to take care of you today, and thank you for coming to our emergency department.  If you have any questions or concerns before leaving please ask the nurse to grab me and I'm more than happy to go through your aftercare instructions again.  If you were prescribed any opioid pain medication today such as Norco, Vicodin, Percocet, morphine, hydrocodone, or oxycodone please make sure you do not drive when you are taking this medication as it can alter your ability to drive safely.  If you have any concerns once you are home that you are not improving or are in fact getting worse before you can make it to your follow-up appointment, please do not hesitate to call 911 and come back for further evaluation.  Merrily BrittleNeil Quinnetta Roepke, MD  Results for orders placed or performed during the hospital encounter of 02/23/18  Basic metabolic panel  Result Value Ref Range   Sodium 141 135 - 145 mmol/L   Potassium 3.4 (L) 3.5 - 5.1 mmol/L   Chloride 104 101 - 111 mmol/L   CO2 26 22 - 32 mmol/L   Glucose, Bld 134 (H) 65 - 99 mg/dL   BUN 11 6 - 20 mg/dL   Creatinine, Ser 1.320.90 0.44 - 1.00 mg/dL   Calcium 9.1 8.9 - 44.010.3 mg/dL   GFR calc non Af Amer >60 >60 mL/min   GFR calc Af Amer >60 >60 mL/min   Anion gap 11 5 - 15  CBC  Result Value Ref Range   WBC 5.9 3.6 - 11.0 K/uL   RBC 4.51 3.80 - 5.20 MIL/uL   Hemoglobin 12.1 12.0 - 16.0 g/dL   HCT 10.235.6 72.535.0 - 36.647.0 %   MCV 78.9 (L) 80.0 - 100.0 fL   MCH 26.9 26.0 - 34.0 pg   MCHC 34.1 32.0 - 36.0 g/dL   RDW 44.014.5 34.711.5 - 42.514.5 %   Platelets 290 150 - 440 K/uL  Troponin I  Result Value Ref Range   Troponin I <0.03 <0.03 ng/mL   Dg Chest 2 View  Result Date: 02/22/2018 CLINICAL DATA:   57 year old female with pain beneath the left breast radiating to the neck tonight. EXAM: CHEST - 2 VIEW COMPARISON:  Chest radiographs 10/25/2016. FINDINGS: Improved, normal lung volumes today. Cardiac size is stable at the upper limits of normal. Other mediastinal contours are within normal limits. Visualized tracheal air column is within normal limits. Both lungs appear clear. No pneumothorax or pleural effusion. No acute osseous abnormality identified. Negative visible bowel gas pattern. IMPRESSION: No acute cardiopulmonary abnormality. Electronically Signed   By: Odessa FlemingH  Hall M.D.   On: 02/22/2018 23:38   Ct Angio Chest/abd/pel For Dissection W And/or W/wo  Result Date: 02/23/2018 CLINICAL DATA:  57 year old female with chest pain. Concern for dissection. EXAM: CT ANGIOGRAPHY CHEST, ABDOMEN AND PELVIS TECHNIQUE: Multidetector CT imaging through the chest, abdomen and pelvis was performed using the standard protocol during bolus administration of intravenous contrast. Multiplanar reconstructed images and MIPs were obtained and reviewed to evaluate the vascular anatomy. CONTRAST:  125mL ISOVUE-370 IOPAMIDOL (ISOVUE-370) INJECTION 76% COMPARISON:  Chest radiograph dated 02/22/2018 FINDINGS: CTA CHEST FINDINGS Cardiovascular: There is no cardiomegaly or pericardial effusion. The thoracic aorta is unremarkable. No  evidence of dissection or aneurysm. The origins of the great vessels of the aortic arch appear patent as visualized. There is diminutive appearance of the left vertebral artery. The central pulmonary arteries are patent and unremarkable. Mediastinum/Nodes: There is no hilar or mediastinal adenopathy. Small hiatal hernia. The esophagus and the thyroid gland are grossly unremarkable. No mediastinal fluid collection or hematoma. Lungs/Pleura: The lungs are clear. There is no pleural effusion or pneumothorax. The central airways are patent. Musculoskeletal: No chest wall abnormality. No acute or significant  osseous findings. Review of the MIP images confirms the above findings. CTA ABDOMEN AND PELVIS FINDINGS VASCULAR Aorta: Normal caliber aorta without aneurysm, dissection, vasculitis or significant stenosis. Celiac: Patent without evidence of aneurysm, dissection, vasculitis or significant stenosis. SMA: Patent without evidence of aneurysm, dissection, vasculitis or significant stenosis. Renals: Both renal arteries are patent without evidence of aneurysm, dissection, vasculitis, fibromuscular dysplasia or significant stenosis. IMA: Patent without evidence of aneurysm, dissection, vasculitis or significant stenosis. Inflow: Patent without evidence of aneurysm, dissection, vasculitis or significant stenosis. Veins: No obvious venous abnormality within the limitations of this arterial phase study. Review of the MIP images confirms the above findings. NON-VASCULAR No intra-abdominal free air or free fluid. Hepatobiliary: No focal liver abnormality is seen. No gallstones, gallbladder wall thickening, or biliary dilatation. Pancreas: Unremarkable. No pancreatic ductal dilatation or surrounding inflammatory changes. Spleen: Normal in size without focal abnormality. Adrenals/Urinary Tract: Adrenal glands are unremarkable. Kidneys are normal, without renal calculi, focal lesion, or hydronephrosis. Bladder is unremarkable. Stomach/Bowel: There is no bowel obstruction or active inflammation. The appendix is not visualized with certainty. No inflammatory changes identified in the right lower quadrant. Lymphatic: No adenopathy. Reproductive: Hysterectomy. Other: Anterior pelvic wall incisional scar Musculoskeletal: No acute or significant osseous findings. Review of the MIP images confirms the above findings. IMPRESSION: No acute intrathoracic, abdominal, or pelvic pathology. No aortic aneurysm or dissection. Electronically Signed   By: Elgie Collard M.D.   On: 02/23/2018 05:16

## 2018-04-05 ENCOUNTER — Other Ambulatory Visit: Payer: Self-pay | Admitting: Family Medicine

## 2018-04-05 DIAGNOSIS — Z1231 Encounter for screening mammogram for malignant neoplasm of breast: Secondary | ICD-10-CM

## 2018-05-13 ENCOUNTER — Ambulatory Visit
Admission: RE | Admit: 2018-05-13 | Discharge: 2018-05-13 | Disposition: A | Discharge status: Home / Self Care | Payer: Medicare Other | Source: Ambulatory Visit | Attending: Family Medicine | Admitting: Family Medicine

## 2018-05-13 DIAGNOSIS — Z1231 Encounter for screening mammogram for malignant neoplasm of breast: Secondary | ICD-10-CM

## 2018-11-15 ENCOUNTER — Emergency Department: Payer: Medicare Other

## 2018-11-15 ENCOUNTER — Other Ambulatory Visit: Payer: Self-pay

## 2018-11-15 ENCOUNTER — Emergency Department
Admission: EM | Admit: 2018-11-15 | Discharge: 2018-11-15 | Disposition: A | Discharge status: Home / Self Care | Payer: Medicare Other | Attending: Emergency Medicine | Admitting: Emergency Medicine

## 2018-11-15 DIAGNOSIS — Z79899 Other long term (current) drug therapy: Secondary | ICD-10-CM | POA: Insufficient documentation

## 2018-11-15 DIAGNOSIS — R42 Dizziness and giddiness: Secondary | ICD-10-CM | POA: Diagnosis present

## 2018-11-15 DIAGNOSIS — I10 Essential (primary) hypertension: Secondary | ICD-10-CM | POA: Diagnosis not present

## 2018-11-15 DIAGNOSIS — E119 Type 2 diabetes mellitus without complications: Secondary | ICD-10-CM | POA: Insufficient documentation

## 2018-11-15 DIAGNOSIS — R112 Nausea with vomiting, unspecified: Secondary | ICD-10-CM | POA: Diagnosis not present

## 2018-11-15 LAB — BASIC METABOLIC PANEL
ANION GAP: 11 (ref 5–15)
BUN: 11 mg/dL (ref 6–20)
CO2: 25 mmol/L (ref 22–32)
Calcium: 9.7 mg/dL (ref 8.9–10.3)
Chloride: 104 mmol/L (ref 98–111)
Creatinine, Ser: 0.57 mg/dL (ref 0.44–1.00)
GFR calc Af Amer: >60 mL/min (ref >60)
GFR calc non Af Amer: >60 mL/min (ref >60)
GLUCOSE: 151 mg/dL — AB (ref 70–99)
Potassium: 3.8 mmol/L (ref 3.5–5.1)
Sodium: 140 mmol/L (ref 135–145)

## 2018-11-15 LAB — CBC
HEMATOCRIT: 38.9 % (ref 36.0–46.0)
Hemoglobin: 12.8 g/dL (ref 12.0–15.0)
MCH: 26.3 pg (ref 26.0–34.0)
MCHC: 32.9 g/dL (ref 30.0–36.0)
MCV: 79.9 fL — AB (ref 80.0–100.0)
Platelets: 240 10*3/uL (ref 150–400)
RBC: 4.87 MIL/uL (ref 3.87–5.11)
RDW: 13.7 % (ref 11.5–15.5)
WBC: 5.1 10*3/uL (ref 4.0–10.5)
nRBC: 0 % (ref 0.0–0.2)

## 2018-11-15 LAB — URINALYSIS, COMPLETE (UACMP) WITH MICROSCOPIC
Bacteria, UA: NONE SEEN
Bilirubin Urine: NEGATIVE
Glucose, UA: NEGATIVE mg/dL
Hgb urine dipstick: NEGATIVE
Ketones, ur: NEGATIVE mg/dL
Leukocytes, UA: NEGATIVE
Nitrite: NEGATIVE
Protein, ur: NEGATIVE mg/dL
Specific Gravity, Urine: 1.009 (ref 1.005–1.030)
pH: 8 (ref 5.0–8.0)

## 2018-11-15 LAB — TROPONIN I: Troponin I: <0.03 ng/mL (ref <0.03)

## 2018-11-15 MED ORDER — SODIUM CHLORIDE 0.9 % IV BOLUS
1000.0000 mL | Freq: Once | INTRAVENOUS | Status: AC
  Administered 2018-11-15: 1000 mL via INTRAVENOUS

## 2018-11-15 MED ORDER — ONDANSETRON HCL 4 MG/2ML IJ SOLN
4.0000 mg | Freq: Once | INTRAMUSCULAR | Status: AC
  Administered 2018-11-15: 4 mg via INTRAVENOUS
  Filled 2018-11-15: qty 2

## 2018-11-15 MED ORDER — ONDANSETRON 4 MG PO TBDP: 4.0000 mg | ORAL_TABLET | Freq: Three times a day (TID) | ORAL | 0 refills | Status: DC | PRN

## 2018-11-15 MED ORDER — MECLIZINE HCL 25 MG PO TABS
25.0000 mg | ORAL_TABLET | Freq: Once | ORAL | Status: AC
  Administered 2018-11-15: 25 mg via ORAL
  Filled 2018-11-15: qty 1

## 2018-11-15 MED ORDER — MECLIZINE HCL 25 MG PO TABS: 25.0000 mg | ORAL_TABLET | Freq: Three times a day (TID) | ORAL | 0 refills | Status: DC | PRN

## 2018-11-15 MED ORDER — IOPAMIDOL (ISOVUE-370) INJECTION 76%
75.0000 mL | Freq: Once | INTRAVENOUS | Status: AC | PRN
  Administered 2018-11-15: 75 mL via INTRAVENOUS

## 2018-11-15 NOTE — ED Provider Notes (Signed)
[facility] Emergency Department Provider Note  ____________________________________________  Time seen: Approximately 1:58 PM  I have reviewed the triage vital signs and the nursing notes.   HISTORY  Chief Complaint Dizziness and Emesis    HPI Mary Cuevas is a 57 y.o. female with a history of HTN, DM, CVA, resenting for dizziness, gait instability, nausea and vomiting.  The patient reports that for the past 3 days, she has been having difficulty walking because of instability and feeling like the room is spinning.  Over the past day, she has also developed nausea and vomiting.  She did not have any acute trauma, symptoms did not start after an acute movement, and she has not had any recent cough or cold symptoms.  She has not had any blurred or double vision, numbness tingling or weakness, changes in speech or changes in mental status.  She has not had any recent changes in her medications.  Past Medical History:  Diagnosis Date  . Chest pain   . Diabetes mellitus without complication (HCC)   . DVT (deep vein thrombosis) in pregnancy   . Ectopic pregnancy   . Hypertension   . 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 Unstable angina (HCC) 10/25/2016  . Chest pain 10/25/2016  . Essential hypertension   . Other hyperlipidemia     Past Surgical History:  Procedure Laterality Date  . ABDOMINAL HYSTERECTOMY    . CARDIAC CATHETERIZATION N/A 11/18/2016   Procedure: Left Heart Cath and Coronary Angiography;  Surgeon: Yvonne Kendallhristopher End, MD;  Location: [facility] INVASIVE CV LAB;  Service: Cardiovascular;  Laterality: N/A;  . KNEE ARTHROSCOPY      Current Outpatient Rx  . Order #: [id number] Class: Historical Med  . Order #: [id number] Class: Historical Med  . Order #: [id number] Class: Historical Med  . Order #: [id number] Class: Normal  . Order #: [id number] Class: Historical Med  . Order #: [id number] Class: Historical Med   . Order #: [id number] Class: Historical Med  . Order #: [id number] Class: Historical Med  . Order #: [id number] Class: Historical Med  . Order #: [id number] Class: Historical Med  . Order #: [id number] Class: Print  . Order #: [id number] Class: Normal  . Order #: [id number] Class: Historical Med  . Order #: [id number] Class: Print  . Order #: [id number] Class: Historical Med  . Order #: [id number] Class: Print  . Order #: [id number] Class: Normal  . Order #: [id number] Class: Historical Med  . Order #: [id number] Class: Print  . Order #: [id number] Class: Historical Med  . Order #: [id number] Class: Historical Med    Allergies Demerol [meperidine]; Vicodin [hydrocodone-acetaminophen]; and Dilaudid [hydromorphone hcl]  Family History  Problem Relation Age of Onset  . Breast cancer Maternal Aunt        50's  . Breast cancer Cousin        1250's    Social History Social History   Tobacco Use  . Smoking status: Never Smoker  . Smokeless tobacco: Never Used  Substance Use Topics  . Alcohol use: No  . Drug use: No    Review of Systems Constitutional: No fever/chills.  No lightheadedness; positive dizziness.  No syncope. Eyes: No visual changes.  No blurred or double vision. ENT: No sore throat. No congestion or rhinorrhea. Cardiovascular: Denies chest pain. Denies palpitations. Respiratory: Denies shortness of breath.  No cough. Gastrointestinal: No abdominal pain.  +nausea, +vomiting.  No diarrhea.  No constipation. Genitourinary: Negative for dysuria. Musculoskeletal: Negative for back pain. Skin: Negative for rash.  Neurological: Negative for headaches. No focal numbness, tingling or weakness.  Positive gait instability.  Positive dizziness.    ____________________________________________   PHYSICAL EXAM:  VITAL SIGNS: ED Triage Vitals  Enc Vitals Group     BP 11/15/18 1153 (!) 172/113     Pulse Rate 11/15/18 1153 (!) 104     Resp 11/15/18 1153 17     Temp 11/15/18 1153 98.1 F (36.7  C)     Temp Source 11/15/18 1153 Oral     SpO2 11/15/18 1153 99 %     Weight 11/15/18 1154 158 lb (71.7 kg)     Height 11/15/18 1154 5\' 3"  (1.6 m)     Head Circumference --      Peak Flow --      Pain Score 11/15/18 1154 0     Pain Loc --      Pain Edu? --      Excl. in GC? --     Constitutional: Alert and oriented. Answers questions appropriately.  Tearful on my examination. Eyes: Conjunctivae are normal.  EOMI. PERRLA.  No scleral icterus.  Patient does not report increased symptomatology with extraocular movements. Head: Atraumatic. Nose: No congestion/rhinnorhea. Mouth/Throat: Mucous membranes are moist.  Neck: No stridor.  Supple.  JVD.  No meningismus. Cardiovascular: Normal rate, regular rhythm. No murmurs, rubs or gallops.  Respiratory: Normal respiratory effort.  No accessory muscle use or retractions. Lungs CTAB.  No wheezes, rales or ronchi. Gastrointestinal: Soft, nontender and nondistended.  No guarding or rebound.  No peritoneal signs. Musculoskeletal: No LE edema. No ttp in the calves or palpable cords.  Negative Homan's sign. Neurologic: Alert and oriented 3. Speech is clear. . Face and smile symmetric. Tongue is midline.  EOMI.  PERRLA.  No horizontal or vertical nystagmus.  No pronator drift. 5 out of 5 grip, biceps, triceps, hip flexors, plantar flexion and dorsiflexion. Normal sensation to light touch in the bilateral upper and lower extremities, and face.  Patient does not have true ataxia with left finger-nose-finger, but is slower on that side.  She has a normal gait without ataxia. Skin:  Skin is warm, dry and intact. No rash noted. Psychiatric: Mood and affect are normal. Speech and behavior are normal.  Normal judgement.  ____________________________________________   LABS (all labs ordered are listed, but only abnormal results are displayed)  Labs Reviewed  BASIC METABOLIC PANEL - Abnormal; Notable for the following components:      Result Value    Glucose, Bld 151 (*)    All other components within normal limits  CBC - Abnormal; Notable for the following components:   MCV 79.9 (*)    All other components within normal limits  URINALYSIS, COMPLETE (UACMP) WITH MICROSCOPIC - Abnormal; Notable for the following components:   Color, Urine COLORLESS (*)    APPearance CLEAR (*)    All other components within normal limits  TROPONIN I  CBG MONITORING, ED   ____________________________________________  EKG  ED ECG REPORT I, Anne-Caroline Sharma Covert, the attending physician, personally viewed and interpreted this ECG.   Date: 11/15/2018  EKG Time: 1149  Rate: 87  Rhythm: normal sinus rhythm  Axis: normal  Intervals:none  ST&T Change: No STEMI  ____________________________________________  RADIOLOGY  Ct Angio Head W Or Wo Contrast  Result Date: 11/15/2018 CLINICAL DATA:  Acute presentation with dizziness and nausea and vomiting over the last 2 days. EXAM: CT ANGIOGRAPHY HEAD AND NECK TECHNIQUE: Multidetector CT imaging of the head and neck was performed  using the standard protocol during bolus administration of intravenous contrast. Multiplanar CT image reconstructions and MIPs were obtained to evaluate the vascular anatomy. Carotid stenosis measurements (when applicable) are obtained utilizing NASCET criteria, using the distal internal carotid diameter as the denominator. CONTRAST:  75mL ISOVUE-370 IOPAMIDOL (ISOVUE-370) INJECTION 76% COMPARISON:  Head CT 09/27/2013 FINDINGS: CT HEAD FINDINGS Brain: The brain shows a normal appearance without evidence of malformation, atrophy, old or acute small or large vessel infarction, mass lesion, hemorrhage, hydrocephalus or extra-axial collection. Vascular: No hyperdense vessel. No evidence of atherosclerotic calcification. Skull: Normal. No traumatic finding. No focal bone lesion. Sinuses/Orbits: Sinuses are clear. Orbits appear normal. Mastoids are clear. Other: None significant CTA NECK FINDINGS  Aortic arch: Normal Right carotid system: Normal Left carotid system: Normal Vertebral arteries: Normal Skeleton: Normal Other neck: Single thyroid nodule at the lower pole on the left with questionable microcalcifications. Diameter is 1.5 cm. Thyroid ultrasound recommended for further evaluation. Upper chest: Normal Review of the MIP images confirms the above findings CTA HEAD FINDINGS Anterior circulation: Normal Posterior circulation:  Normal Venous sinuses: Normal Anatomic variants: None Delayed phase: Normal Review of the MIP images confirms the above findings IMPRESSION: 1. Normal head CT. 2. Normal CT angiography of the neck and head. 3. Single thyroid nodule at the lower pole on the left measuring 1.5 cm. Possible microcalcifications. Thyroid ultrasound recommended for further evaluation. Electronically Signed   By: Paulina Fusi M.D.   On: 11/15/2018 14:53   Ct Angio Neck W And/or Wo Contrast  Result Date: 11/15/2018 CLINICAL DATA:  Acute presentation with dizziness and nausea and vomiting over the last 2 days. EXAM: CT ANGIOGRAPHY HEAD AND NECK TECHNIQUE: Multidetector CT imaging of the head and neck was performed using the standard protocol during bolus administration of intravenous contrast. Multiplanar CT image reconstructions and MIPs were obtained to evaluate the vascular anatomy. Carotid stenosis measurements (when applicable) are obtained utilizing NASCET criteria, using the distal internal carotid diameter as the denominator. CONTRAST:  75mL ISOVUE-370 IOPAMIDOL (ISOVUE-370) INJECTION 76% COMPARISON:  Head CT 09/27/2013 FINDINGS: CT HEAD FINDINGS Brain: The brain shows a normal appearance without evidence of malformation, atrophy, old or acute small or large vessel infarction, mass lesion, hemorrhage, hydrocephalus or extra-axial collection. Vascular: No hyperdense vessel. No evidence of atherosclerotic calcification. Skull: Normal. No traumatic finding. No focal bone lesion. Sinuses/Orbits:  Sinuses are clear. Orbits appear normal. Mastoids are clear. Other: None significant CTA NECK FINDINGS Aortic arch: Normal Right carotid system: Normal Left carotid system: Normal Vertebral arteries: Normal Skeleton: Normal Other neck: Single thyroid nodule at the lower pole on the left with questionable microcalcifications. Diameter is 1.5 cm. Thyroid ultrasound recommended for further evaluation. Upper chest: Normal Review of the MIP images confirms the above findings CTA HEAD FINDINGS Anterior circulation: Normal Posterior circulation:  Normal Venous sinuses: Normal Anatomic variants: None Delayed phase: Normal Review of the MIP images confirms the above findings IMPRESSION: 1. Normal head CT. 2. Normal CT angiography of the neck and head. 3. Single thyroid nodule at the lower pole on the left measuring 1.5 cm. Possible microcalcifications. Thyroid ultrasound recommended for further evaluation. Electronically Signed   By: Paulina Fusi M.D.   On: 11/15/2018 14:53    ____________________________________________   PROCEDURES  Procedure(s) performed: None  Procedures  Critical Care performed: No ____________________________________________   INITIAL IMPRESSION / ASSESSMENT AND PLAN / ED COURSE  Pertinent labs & imaging results that were available during my care of the patient were reviewed by me and  considered in my medical decision making (see chart for details).  57 y.o. female with a history of prior CVA, HTN, HL, presenting with room spinning dizziness, nausea and vomiting.  Overall, the patient is hypertensive upon arrival.  I do not see any focal neurologic deficits on my exam, but I am concerned about peripheral versus central vertigo in this patient.  A CT with head and neck angiogram have been ordered.  We will also check for UTI, electrolyte abnormality, anemia.  Plan reevaluation for final disposition.  ----------------------------------------- 3:16 PM on  11/15/2018 -----------------------------------------  The patient's work-up here is reassuring.  She continues to be with out any focal neurologic deficits.  Her CT and CT angiograms are negative for any acute process.  The patient does not have any ataxia on my exam.  Here, the patient's electrolytes are reassuring with the exception of mildly elevated glucose at 151.  This is unlikely to be the cause of her symptoms.  Her blood counts are reassuring.  Her UA does not show any infection.  Her troponin is reassuring.  At this time we will plan to discharge the patient home with PMD follow-up.  ____________________________________________  FINAL CLINICAL IMPRESSION(S) / ED DIAGNOSES  Final diagnoses:  Dizziness  Non-intractable vomiting with nausea, unspecified vomiting type         NEW MEDICATIONS STARTED DURING THIS VISIT:  New Prescriptions   MECLIZINE (ANTIVERT) 25 MG TABLET    Take 1 tablet (25 mg total) by mouth 3 (three) times daily as needed for dizziness.   ONDANSETRON (ZOFRAN ODT) 4 MG DISINTEGRATING TABLET    Take 1 tablet (4 mg total) by mouth every 8 (eight) hours as needed for nausea or vomiting.      Rockne Menghini, MD 11/15/18 [id number]

## 2018-11-15 NOTE — ED Notes (Signed)
Patient transported to CT 

## 2018-11-15 NOTE — ED Notes (Signed)
Pt c/o "feeling sick" since Sunday night at approx 6pm. Pt states nausea, had 1 episode of vomiting last night. Pt is neurologically intact, denies worsening of dizziness with turning of her head. Grip strength moderate and equal bilaterally, A&O x 4, denies numbness/tingling to extremities.

## 2018-11-15 NOTE — Discharge Instructions (Addendum)
Please drink plenty of fluids to stay well-hydrated.  Follow-up with your primary care physician.  Return to the emergency department if you develop lightheadedness, fainting, numbness tingling or weakness, changes in vision speech or mental status, or any other symptoms concerning to you.

## 2018-11-15 NOTE — ED Triage Notes (Signed)
Pt c/o dizziness with N/V for the past 2 days. Denies fever or diarrhea. States "My balance is off"..Marland Kitchen

## 2018-12-14 ENCOUNTER — Other Ambulatory Visit: Payer: Self-pay | Admitting: Family Medicine

## 2018-12-14 DIAGNOSIS — E041 Nontoxic single thyroid nodule: Secondary | ICD-10-CM

## 2018-12-20 ENCOUNTER — Ambulatory Visit
Admission: RE | Admit: 2018-12-20 | Discharge: 2018-12-20 | Disposition: A | Discharge status: Home / Self Care | Payer: Medicare Other | Source: Ambulatory Visit | Attending: Family Medicine | Admitting: Family Medicine

## 2018-12-20 DIAGNOSIS — E041 Nontoxic single thyroid nodule: Secondary | ICD-10-CM | POA: Diagnosis present

## 2021-03-07 ENCOUNTER — Other Ambulatory Visit: Payer: Self-pay | Admitting: Family Medicine

## 2021-03-07 DIAGNOSIS — Z1231 Encounter for screening mammogram for malignant neoplasm of breast: Secondary | ICD-10-CM

## 2021-06-29 ENCOUNTER — Encounter (HOSPITAL_COMMUNITY): Payer: Self-pay | Admitting: Emergency Medicine

## 2021-06-29 ENCOUNTER — Inpatient Hospital Stay (HOSPITAL_COMMUNITY)
Admission: EM | Admit: 2021-06-29 | Discharge: 2021-07-09 | DRG: 304 | Disposition: A | Discharge status: Home Health | Payer: Medicare Other | Attending: Internal Medicine | Admitting: Internal Medicine

## 2021-06-29 ENCOUNTER — Emergency Department (HOSPITAL_COMMUNITY): Payer: Medicare Other

## 2021-06-29 ENCOUNTER — Other Ambulatory Visit: Payer: Self-pay

## 2021-06-29 DIAGNOSIS — J1282 Pneumonia due to coronavirus disease 2019: Secondary | ICD-10-CM | POA: Diagnosis present

## 2021-06-29 DIAGNOSIS — Z794 Long term (current) use of insulin: Secondary | ICD-10-CM

## 2021-06-29 DIAGNOSIS — Z885 Allergy status to narcotic agent status: Secondary | ICD-10-CM

## 2021-06-29 DIAGNOSIS — Z90710 Acquired absence of both cervix and uterus: Secondary | ICD-10-CM

## 2021-06-29 DIAGNOSIS — I161 Hypertensive emergency: Principal | ICD-10-CM | POA: Diagnosis present

## 2021-06-29 DIAGNOSIS — Z8673 Personal history of transient ischemic attack (TIA), and cerebral infarction without residual deficits: Secondary | ICD-10-CM

## 2021-06-29 DIAGNOSIS — Z8249 Family history of ischemic heart disease and other diseases of the circulatory system: Secondary | ICD-10-CM

## 2021-06-29 DIAGNOSIS — I160 Hypertensive urgency: Secondary | ICD-10-CM | POA: Diagnosis present

## 2021-06-29 DIAGNOSIS — R3915 Urgency of urination: Secondary | ICD-10-CM | POA: Diagnosis present

## 2021-06-29 DIAGNOSIS — Z888 Allergy status to other drugs, medicaments and biological substances status: Secondary | ICD-10-CM

## 2021-06-29 DIAGNOSIS — R2990 Unspecified symptoms and signs involving the nervous system: Secondary | ICD-10-CM

## 2021-06-29 DIAGNOSIS — U071 COVID-19: Secondary | ICD-10-CM | POA: Diagnosis present

## 2021-06-29 DIAGNOSIS — E119 Type 2 diabetes mellitus without complications: Secondary | ICD-10-CM

## 2021-06-29 DIAGNOSIS — F411 Generalized anxiety disorder: Secondary | ICD-10-CM

## 2021-06-29 DIAGNOSIS — E11649 Type 2 diabetes mellitus with hypoglycemia without coma: Secondary | ICD-10-CM | POA: Diagnosis not present

## 2021-06-29 DIAGNOSIS — I10 Essential (primary) hypertension: Secondary | ICD-10-CM | POA: Diagnosis present

## 2021-06-29 DIAGNOSIS — Z79899 Other long term (current) drug therapy: Secondary | ICD-10-CM

## 2021-06-29 DIAGNOSIS — I959 Hypotension, unspecified: Secondary | ICD-10-CM | POA: Diagnosis not present

## 2021-06-29 DIAGNOSIS — Z86718 Personal history of other venous thrombosis and embolism: Secondary | ICD-10-CM

## 2021-06-29 DIAGNOSIS — R0602 Shortness of breath: Secondary | ICD-10-CM

## 2021-06-29 DIAGNOSIS — F419 Anxiety disorder, unspecified: Secondary | ICD-10-CM | POA: Diagnosis present

## 2021-06-29 DIAGNOSIS — R29810 Facial weakness: Secondary | ICD-10-CM | POA: Diagnosis present

## 2021-06-29 DIAGNOSIS — Z66 Do not resuscitate: Secondary | ICD-10-CM | POA: Diagnosis present

## 2021-06-29 DIAGNOSIS — E1142 Type 2 diabetes mellitus with diabetic polyneuropathy: Secondary | ICD-10-CM | POA: Diagnosis present

## 2021-06-29 LAB — COMPREHENSIVE METABOLIC PANEL
ALT: 10 U/L (ref 0–44)
AST: 18 U/L (ref 15–41)
Albumin: 3.7 g/dL (ref 3.5–5.0)
Alkaline Phosphatase: 85 U/L (ref 38–126)
Anion gap: 9 (ref 5–15)
BUN: 11 mg/dL (ref 6–20)
CO2: 23 mmol/L (ref 22–32)
Calcium: 8.8 mg/dL — ABNORMAL LOW (ref 8.9–10.3)
Chloride: 107 mmol/L (ref 98–111)
Creatinine, Ser: 0.76 mg/dL (ref 0.44–1.00)
GFR, Estimated: >60 mL/min (ref >60)
Glucose, Bld: 107 mg/dL — ABNORMAL HIGH (ref 70–99)
Potassium: 3.2 mmol/L — ABNORMAL LOW (ref 3.5–5.1)
Sodium: 139 mmol/L (ref 135–145)
Total Bilirubin: 0.8 mg/dL (ref 0.3–1.2)
Total Protein: 7 g/dL (ref 6.5–8.1)

## 2021-06-29 LAB — CBC
HCT: 35.5 % — ABNORMAL LOW (ref 36.0–46.0)
Hemoglobin: 11.9 g/dL — ABNORMAL LOW (ref 12.0–15.0)
MCH: 27.4 pg (ref 26.0–34.0)
MCHC: 33.5 g/dL (ref 30.0–36.0)
MCV: 81.8 fL (ref 80.0–100.0)
Platelets: 202 10*3/uL (ref 150–400)
RBC: 4.34 MIL/uL (ref 3.87–5.11)
RDW: 13.7 % (ref 11.5–15.5)
WBC: 3.9 10*3/uL — ABNORMAL LOW (ref 4.0–10.5)
nRBC: 0 % (ref 0.0–0.2)

## 2021-06-29 LAB — I-STAT CHEM 8, ED
BUN: 11 mg/dL (ref 6–20)
Calcium, Ion: 1.08 mmol/L — ABNORMAL LOW (ref 1.15–1.40)
Chloride: 106 mmol/L (ref 98–111)
Creatinine, Ser: 0.7 mg/dL (ref 0.44–1.00)
Glucose, Bld: 105 mg/dL — ABNORMAL HIGH (ref 70–99)
HCT: 37 % (ref 36.0–46.0)
Hemoglobin: 12.6 g/dL (ref 12.0–15.0)
Potassium: 3.2 mmol/L — ABNORMAL LOW (ref 3.5–5.1)
Sodium: 141 mmol/L (ref 135–145)
TCO2: 24 mmol/L (ref 22–32)

## 2021-06-29 LAB — URINALYSIS, ROUTINE W REFLEX MICROSCOPIC
Bilirubin Urine: NEGATIVE
Glucose, UA: NEGATIVE mg/dL
Hgb urine dipstick: NEGATIVE
Ketones, ur: NEGATIVE mg/dL
Leukocytes,Ua: NEGATIVE
Nitrite: NEGATIVE
Protein, ur: NEGATIVE mg/dL
Specific Gravity, Urine: 1.01 (ref 1.005–1.030)
pH: 7 (ref 5.0–8.0)

## 2021-06-29 LAB — CBG MONITORING, ED: Glucose-Capillary: 107 mg/dL — ABNORMAL HIGH (ref 70–99)

## 2021-06-29 LAB — RESP PANEL BY RT-PCR (FLU A&B, COVID) ARPGX2
Influenza A by PCR: NEGATIVE
Influenza B by PCR: NEGATIVE
SARS Coronavirus 2 by RT PCR: POSITIVE — AB

## 2021-06-29 LAB — PROTIME-INR
INR: 1 (ref 0.8–1.2)
Prothrombin Time: 13.7 seconds (ref 11.4–15.2)

## 2021-06-29 LAB — APTT: aPTT: 31 seconds (ref 24–36)

## 2021-06-29 LAB — ETHANOL: Alcohol, Ethyl (B): <10 mg/dL (ref <10)

## 2021-06-29 LAB — DIFFERENTIAL
Abs Immature Granulocytes: 0.01 10*3/uL (ref 0.00–0.07)
Basophils Absolute: 0 10*3/uL (ref 0.0–0.1)
Basophils Relative: 1 %
Eosinophils Absolute: 0.1 10*3/uL (ref 0.0–0.5)
Eosinophils Relative: 2 %
Immature Granulocytes: 0 %
Lymphocytes Relative: 33 %
Lymphs Abs: 1.3 10*3/uL (ref 0.7–4.0)
Monocytes Absolute: 0.5 10*3/uL (ref 0.1–1.0)
Monocytes Relative: 13 %
Neutro Abs: 2 10*3/uL (ref 1.7–7.7)
Neutrophils Relative %: 51 %

## 2021-06-29 LAB — RAPID URINE DRUG SCREEN, HOSP PERFORMED
Amphetamines: NOT DETECTED
Barbiturates: NOT DETECTED
Benzodiazepines: NOT DETECTED
Cocaine: NOT DETECTED
Opiates: NOT DETECTED
Tetrahydrocannabinol: NOT DETECTED

## 2021-06-29 LAB — I-STAT BETA HCG BLOOD, ED (MC, WL, AP ONLY): I-stat hCG, quantitative: <5 m[IU]/mL (ref <5)

## 2021-06-29 LAB — CK: Total CK: 105 U/L (ref 38–234)

## 2021-06-29 MED ORDER — SODIUM CHLORIDE 0.9 % IV SOLN: INTRAVENOUS | Status: DC

## 2021-06-29 MED ORDER — KETOROLAC TROMETHAMINE 30 MG/ML IJ SOLN
30.0000 mg | Freq: Once | INTRAMUSCULAR | Status: AC
  Administered 2021-06-29: 30 mg via INTRAVENOUS
  Filled 2021-06-29: qty 1

## 2021-06-29 MED ORDER — LABETALOL HCL 5 MG/ML IV SOLN
10.0000 mg | Freq: Once | INTRAVENOUS | Status: AC
  Administered 2021-06-29: 10 mg via INTRAVENOUS
  Filled 2021-06-29: qty 4

## 2021-06-29 NOTE — Consult Note (Signed)
Neurology Consultation  Reason for Consult: Dr Bernette Mayers Referring Physician: Left facial weakness, pain in all 4 extremities, weakness in all 4   CC: Left facial weakness, pain in all 4 extremities weakness all 4 extremities  History is obtained from: Chart, patient's brother at bedside, patient  HPI: Mary Cuevas is a 60 y.o. female past medical history of hypertension, diabetes, osteoarthritis, who has undergone evaluation with cardiac catheterization for chest pain with diagnosis of noncardiac chest pain, presented for evaluation of left-sided facial weakness, generalized body weakness and pain in all 4 extremities-activated as a code stroke secondary to facial involvement and brought in to [facility] for emergent evaluation outside the 4-1/2-hour window by [facility]. She reports that she was in her usual state of health sometime yesterday, started feeling heavy all over but the facial weakness and the whole body pain exacerbated sometime this evening with the last normal she was feeling around 2 PM today. Not a great historian. Brother came on later-said he was informed of the same problems but he was not with her today.  He was with her yesterday and she seemed okay. Denies a headache at this time but reports pain in all 4 extremities-exquisitely tender to touch all over.  LKW: Either yesterday or 2 PM today-outside the window for IV tPA tpa given?: no, outside the window for tPA Premorbid modified Rankin scale (mRS): 0  ROS: Full ROS was performed and is negative except as noted in the HPI. Past Medical History:  Diagnosis Date   Chest pain    Diabetes mellitus without complication (HCC)    DVT (deep vein thrombosis) in pregnancy    Ectopic pregnancy    Hypertension    Stroke (cerebrum) (HCC)         Family History  Problem Relation Age of Onset   Breast cancer Maternal Aunt        50's   Breast cancer Cousin        66's     Social History:   reports  that she has never smoked. She has never used smokeless tobacco. She reports that she does not drink alcohol and does not use drugs.  Medications  Current Facility-Administered Medications:    labetalol (NORMODYNE) injection 10 mg, 10 mg, Intravenous, Once, Milon Dikes, MD  Current Outpatient Medications:    amLODipine (NORVASC) 10 MG tablet, Take 10 mg by mouth daily., Disp: , Rfl:    aspirin EC 81 MG tablet, Take 81 mg by mouth daily., Disp: , Rfl:    Biotin 1000 MCG tablet, Take 1,000 mcg by mouth daily., Disp: , Rfl:    carvedilol (COREG) 25 MG tablet, Take 1 tablet (25 mg total) by mouth 2 (two) times daily with a meal., Disp: 60 tablet, Rfl: 5   clobetasol cream (TEMOVATE) 0.05 %, Apply 1 application topically 2 (two) times daily., Disp: , Rfl:    DULoxetine (CYMBALTA) 30 MG capsule, Take 30 mg by mouth daily., Disp: , Rfl:    gabapentin (NEURONTIN) 300 MG capsule, Take 300 mg by mouth 3 (three) times daily., Disp: , Rfl:    glipiZIDE (GLUCOTROL XL) 10 MG 24 hr tablet, Take 10 mg by mouth daily with breakfast., Disp: , Rfl:    hydrochlorothiazide (HYDRODIURIL) 25 MG tablet, Take 25 mg by mouth daily., Disp: , Rfl:    hydroxychloroquine (PLAQUENIL) 200 MG tablet, Take 200 mg by mouth 2 (two) times daily., Disp: , Rfl:    ibuprofen (ADVIL,MOTRIN) 600 MG tablet,  Take 1 tablet (600 mg total) by mouth every 8 (eight) hours as needed., Disp: 30 tablet, Rfl: 0   isosorbide mononitrate (IMDUR) 30 MG 24 hr tablet, Take 1 tablet (30 mg total) by mouth daily., Disp: 30 tablet, Rfl: 6   lisinopril (PRINIVIL,ZESTRIL) 40 MG tablet, Take 40 mg by mouth daily., Disp: , Rfl:    meclizine (ANTIVERT) 25 MG tablet, Take 1 tablet (25 mg total) by mouth 3 (three) times daily as needed for dizziness., Disp: 10 tablet, Rfl: 0   nitroGLYCERIN (NITROSTAT) 0.4 MG SL tablet, Place 0.4 mg under the tongue every 5 (five) minutes as needed for chest pain., Disp: , Rfl:    omeprazole (PRILOSEC) 20 MG capsule, Take 1  capsule (20 mg total) by mouth 2 (two) times daily before a meal., Disp: 60 capsule, Rfl: 2   ondansetron (ZOFRAN ODT) 4 MG disintegrating tablet, Take 1 tablet (4 mg total) by mouth every 8 (eight) hours as needed for nausea or vomiting., Disp: 10 tablet, Rfl: 0   rosuvastatin (CRESTOR) 40 MG tablet, Take 40 mg by mouth at bedtime. , Disp: , Rfl:    sucralfate (CARAFATE) 1 g tablet, Take 1 tablet (1 g total) by mouth 4 (four) times daily., Disp: 120 tablet, Rfl: 1   triamcinolone cream (KENALOG) 0.1 %, Apply 1 application topically 2 (two) times daily., Disp: , Rfl:    Vitamin D, Ergocalciferol, (DRISDOL) 50000 units CAPS capsule, Take 50,000 Units by mouth every 7 (seven) days., Disp: , Rfl:    Exam: Current vital signs: BP (!) 204/120 (BP Location: Left Arm)   Pulse 95   Temp 98.6 F (37 C) (Oral)   Ht 5\' 3"  (1.6 m)   Wt 67.6 kg   SpO2 99%   BMI 26.40 kg/m  Vital signs in last 24 hours: Temp:  [98.6 F (37 C)] 98.6 F (37 C) (07/31 2027) Pulse Rate:  [95] 95 (07/31 2027) BP: (204)/(120) 204/120 (07/31 2027) SpO2:  [99 %] 99 % (07/31 2027) Weight:  [67.6 kg] 67.6 kg (07/31 2035) General: Anxious appearing, well-developed well-nourished female in bed HEENT: Normocephalic, atraumatic CVS: Regular rate rhythm, extremely hypertensive with systolic blood pressure in the 220s. Respiratory: Breathing well saturating normally on room air. Abdomen: Rash over the umbilicus, tender to touch everywhere. Extremities warm well perfused with exquisite tenderness on any palpation. Neurological exam Awake alert oriented x3 In distress due to pain Speech is mildly dysarthric No evidence of aphasia Cranial nerves: Pupils equal round reactive to light, extraocular movements intact, visual fields appear full, facial sensation intact, face appears asymmetric with volitional clenching of the teeth and volitional holding of the left angle of the mouth.  Unable to get her tongue out of the mouth due  to "inability to open the mouth at all". Motor exam with at least antigravity strength in bilateral upper extremities with giveaway weakness and exquisite tenderness on palpation.  Both lower extremities are not antigravity although I suspect that there is some functional overlay to this. Sensation: Hyperesthetic all over Coordination: No obvious dysmetria NIH stroke scale-9  Labs I have reviewed labs in epic and the results pertinent to this consultation are:   CBC    Component Value Date/Time   WBC 3.9 (L) 06/29/2021 2010   RBC 4.34 06/29/2021 2010   HGB 12.6 06/29/2021 2016   HGB CANCELED 11/10/2016 1501   HCT 37.0 06/29/2021 2016   HCT CANCELED 11/10/2016 1501   PLT 202 06/29/2021 2010   PLT CANCELED  11/10/2016 1501   MCV 81.8 06/29/2021 2010   MCV 80 10/04/2013 1343   MCH 27.4 06/29/2021 2010   MCHC 33.5 06/29/2021 2010   RDW 13.7 06/29/2021 2010   RDW 13.4 10/04/2013 1343   LYMPHSABS 1.3 06/29/2021 2010   LYMPHSABS CANCELED 11/10/2016 1501   LYMPHSABS 2.7 09/27/2013 1948   MONOABS 0.5 06/29/2021 2010   MONOABS 0.5 09/27/2013 1948   EOSABS 0.1 06/29/2021 2010   EOSABS CANCELED 11/10/2016 1501   EOSABS 0.2 09/27/2013 1948   BASOSABS 0.0 06/29/2021 2010   BASOSABS CANCELED 11/10/2016 1501   BASOSABS 0.1 09/27/2013 1948    CMP     Component Value Date/Time   NA 141 06/29/2021 2016   NA 141 11/10/2016 1501   NA 138 10/04/2013 1343   K 3.2 (L) 06/29/2021 2016   K 3.8 10/04/2013 1343   CL 106 06/29/2021 2016   CL 107 10/04/2013 1343   CO2 23 06/29/2021 2010   CO2 26 10/04/2013 1343   GLUCOSE 105 (H) 06/29/2021 2016   GLUCOSE 149 (H) 10/04/2013 1343   BUN 11 06/29/2021 2016   BUN 13 11/10/2016 1501   BUN 21 (H) 10/04/2013 1343   CREATININE 0.70 06/29/2021 2016   CREATININE 0.91 10/04/2013 1343   CALCIUM 8.8 (L) 06/29/2021 2010   CALCIUM 10.2 (H) 10/04/2013 1343   PROT 7.0 06/29/2021 2010   PROT 9.0 (H) 10/04/2013 1343   ALBUMIN 3.7 06/29/2021 2010    ALBUMIN 4.4 10/04/2013 1343   AST 18 06/29/2021 2010   AST 50 (H) 10/04/2013 1343   ALT 10 06/29/2021 2010   ALT 64 10/04/2013 1343   ALKPHOS 85 06/29/2021 2010   ALKPHOS 170 (H) 10/04/2013 1343   BILITOT 0.8 06/29/2021 2010   BILITOT 0.3 10/04/2013 1343   GFRNONAA >60 06/29/2021 2010   GFRNONAA >60 10/04/2013 1343   GFRAA >60 11/15/2018 1156   GFRAA >60 10/04/2013 1343   Imaging I have reviewed the images obtained:  CT-head-no acute changes.  No bleed.  Aspects 10 MRI in Care Everywhere from 2014 with the indications of dysarthria, left facial droop and bilateral extremity weakness evaluate for infarct or posterior reversible encephalopathy syndrome-was read normal on 09/28/2013.  Assessment: 60 year old past history of hypertension, diabetes, osteoarthritis, noncardiac chest pain, presenting for left facial weakness, slurred speech, generalized weakness and pain in all 4 extremities for an emergent stroke evaluation. On examination does reveal some volitional weakness on the left face as well as exquisite tenderness to palpation on all extremities and effort dependent weakness with the strength is worse on bilateral lower extremities compared to bilateral upper extremities. I suspect that the hypertensive urgency that she is exhibiting-systolic blood pressures of 220s, probably contributing to some of this but part of this might be a nonneurological etiology. Given her risk factors, I would definitely advise an MRI of the brain. I would also recommend controlling her blood pressures-since stroke remains on the differential, and would like to allow for some permissive hypertension, would still recommend reducing blood pressure and treating only if it goes above 180 for now.   Recommendations: Stat MRI of the brain without contrast Blood pressure management-reduce blood pressure with a goal of keeping it closer to 180. Pain management per primary team I will follow-up on the imaging  studies.  She would require full stroke work-up if the MRI is positive for stroke otherwise no further neurological work-up indicated.  Discussed with Dr. Bernette MayersSheldon -- Milon DikesAshish Wilba Mutz, MD Neurologist [facility] Pager: [phone]  ADDENDUM MRI negative for acute process. Incidentally COVID positive Symptoms likely due to HTN urgency and COVID infection with a component of functional overlay Management of HTN urgency and COVID per ER/primary team No further neurological testing recommended at this time.  D/W Dr. Blinda Leatherwood  -- Milon Dikes, MD Neurologist [facility] Pager: [phone]

## 2021-06-29 NOTE — ED Triage Notes (Signed)
Pt arrived via [facility] as code stroke from home. Pt LKN 1400, pt has generalized weakness in all extremities, slightly more weak on R side, c/o loss of bladder control, and has R facial droop. Pt hypertensive 210/150, takes BP meds, has not missed any doses. AOx4

## 2021-06-29 NOTE — ED Provider Notes (Signed)
[facility] EMERGENCY DEPARTMENT Provider Note  CSN: [id number] Arrival date & time: 06/29/21 2007    History Chief Complaint  Patient presents with   Code Stroke    Mary Cuevas is a 60 y.o. female with history of HTN, DM, prior stroke without deficits brought by EMS as a code stroke for L facial droop and general extremity weakness. Had some weakness last night but facial droop began at 2pm today while at work. She is complaining of pain in her arms, legs and face. Patient screened on arrival and taken emergently to CT.    Past Medical History:  Diagnosis Date   Chest pain    Diabetes mellitus without complication (HCC)    DVT (deep vein thrombosis) in pregnancy    Ectopic pregnancy    Hypertension    Stroke (cerebrum) [facility])     Past Surgical History:  Procedure Laterality Date   ABDOMINAL HYSTERECTOMY     CARDIAC CATHETERIZATION N/A 11/18/2016   Procedure: Left Heart Cath and Coronary Angiography;  Surgeon: Yvonne Kendall, MD;  Location: [facility] INVASIVE CV LAB;  Service: Cardiovascular;  Laterality: N/A;   KNEE ARTHROSCOPY      Family History  Problem Relation Age of Onset   Breast cancer Maternal Aunt        50's   Breast cancer Cousin        52's    Social History   Tobacco Use   Smoking status: Never   Smokeless tobacco: Never  Substance Use Topics   Alcohol use: No   Drug use: No     Home Medications Prior to Admission medications   Medication Sig Start Date End Date Taking? Authorizing Provider  amLODipine (NORVASC) 10 MG tablet Take 10 mg by mouth daily.   Yes [provider]  aspirin EC 81 MG tablet Take 81 mg by mouth daily.   Yes [provider]  Biotin 1000 MCG tablet Take 1,000 mcg by mouth daily.   Yes [provider]  carvedilol (COREG) 12.5 MG tablet Take 12.5 mg by mouth 2 (two) times daily. 06/06/21  Yes [provider]  glipiZIDE (GLUCOTROL) 10 MG tablet Take 10 mg by mouth 2 (two) times daily.  06/06/21  Yes [provider]  hydrochlorothiazide (MICROZIDE) 12.5 MG capsule Take 12.5 mg by mouth daily. 06/06/21  Yes [provider]  nitroGLYCERIN (NITROSTAT) 0.4 MG SL tablet Place 0.4 mg under the tongue every 5 (five) minutes as needed for chest pain.   Yes [provider]  omeprazole (PRILOSEC) 20 MG capsule Take 1 capsule (20 mg total) by mouth 2 (two) times daily before a meal. Patient taking differently: Take 20 mg by mouth daily. 03/04/17  Yes Emily Filbert, MD  rosuvastatin (CRESTOR) 40 MG tablet Take 40 mg by mouth at bedtime.    Yes [provider]  TRADJENTA 5 MG TABS tablet Take 5 mg by mouth daily. 06/06/21  Yes [provider]  Vitamin D, Ergocalciferol, (DRISDOL) 50000 units CAPS capsule Take 50,000 Units by mouth every 7 (seven) days.   Yes [provider]  carvedilol (COREG) 25 MG tablet Take 1 tablet (25 mg total) by mouth 2 (two) times daily with a meal. Patient not taking: Reported on 06/30/2021 11/18/16   End, Cristal Deer, MD  gabapentin (NEURONTIN) 300 MG capsule Take 300 mg by mouth 3 (three) times daily. Patient not taking: Reported on 06/30/2021    [provider]  glipiZIDE (GLUCOTROL XL) 10 MG 24  hr tablet Take 10 mg by mouth daily with breakfast. Patient not taking: Reported on 06/30/2021    [provider]  DULoxetine (CYMBALTA) 30 MG capsule Take 30 mg by mouth daily.  06/30/21  [provider]     Allergies    Demerol [meperidine], Vicodin [hydrocodone-acetaminophen], and Dilaudid [hydromorphone hcl]   Review of Systems   Review of Systems Unable to assess due to acuity of condition  Physical Exam BP 125/79   Pulse 73   Temp 98.6 F (37 C) (Oral)   Resp 17   Ht 5\' 3"  (1.6 m)   Wt 67.6 kg   SpO2 98%   BMI 26.40 kg/m   Physical Exam Vitals and nursing note reviewed.  Constitutional:      Appearance: Normal appearance.  HENT:     Head: Normocephalic and atraumatic.      Nose: Nose normal.     Mouth/Throat:     Mouth: Mucous membranes are moist.  Eyes:     Extraocular Movements: Extraocular movements intact.     Conjunctiva/sclera: Conjunctivae normal.  Cardiovascular:     Rate and Rhythm: Normal rate.  Pulmonary:     Effort: Pulmonary effort is normal.     Breath sounds: Normal breath sounds.  Abdominal:     General: Abdomen is flat.     Palpations: Abdomen is soft.     Tenderness: There is no abdominal tenderness.  Musculoskeletal:        General: Tenderness (palpation of diffuse soft tissues) present. No swelling. Normal range of motion.     Cervical back: Neck supple.  Skin:    General: Skin is warm and dry.  Neurological:     Mental Status: She is alert.     Comments: R corner of mouth drooping, L nasolabial fold flattening; able to move all extremities but does not have good effort due to reported pain  Psychiatric:     Comments: tearful     ED Results / Procedures / Treatments   Labs (all labs ordered are listed, but only abnormal results are displayed) Labs Reviewed  RESP PANEL BY RT-PCR (FLU A&B, COVID) ARPGX2 - Abnormal; Notable for the following components:      Result Value   SARS Coronavirus 2 by RT PCR POSITIVE (*)    All other components within normal limits  CBC - Abnormal; Notable for the following components:   WBC 3.9 (*)    Hemoglobin 11.9 (*)    HCT 35.5 (*)    All other components within normal limits  COMPREHENSIVE METABOLIC PANEL - Abnormal; Notable for the following components:   Potassium 3.2 (*)    Glucose, Bld 107 (*)    Calcium 8.8 (*)    All other components within normal limits  HEMOGLOBIN A1C - Abnormal; Notable for the following components:   Hgb A1c MFr Bld 7.0 (*)    All other components within normal limits  BASIC METABOLIC PANEL - Abnormal; Notable for the following components:   Potassium 3.2 (*)    Glucose, Bld 111 (*)    All other components within normal limits  CBG MONITORING, ED -  Abnormal; Notable for the following components:   Glucose-Capillary 107 (*)    All other components within normal limits  I-STAT CHEM 8, ED - Abnormal; Notable for the following components:   Potassium 3.2 (*)    Glucose, Bld 105 (*)    Calcium, Ion 1.08 (*)    All other components within normal limits  CBG MONITORING, ED - Abnormal; Notable for the following components:   Glucose-Capillary 136 (*)    All other components within normal limits  ETHANOL  PROTIME-INR  APTT  DIFFERENTIAL  RAPID URINE DRUG SCREEN, HOSP PERFORMED  URINALYSIS, ROUTINE W REFLEX MICROSCOPIC  CK  HIV ANTIBODY (ROUTINE TESTING W REFLEX)  MAGNESIUM  I-STAT BETA HCG BLOOD, ED ([facility], [facility], [facility] ONLY)    EKG EKG Interpretation  Date/Time:  Sunday June 29 2021 20:37:45 EDT Ventricular Rate:  90 PR Interval:  134 QRS Duration: 73 QT Interval:  350 QTC Calculation: 429 R Axis:   70 Text Interpretation: Sinus rhythm Anteroseptal infarct, old Borderline repolarization abnormality No significant change since last tracing Confirmed by Susy Frizzle [phone]) on 06/29/2021 8:49:42 PM  Radiology MR BRAIN WO CONTRAST  Result Date: 06/30/2021 CLINICAL DATA:  Initial evaluation for acute left-sided facial droop. EXAM: MRI HEAD WITHOUT CONTRAST TECHNIQUE: Multiplanar, multiecho pulse sequences of the brain and surrounding structures were obtained without intravenous contrast. COMPARISON:  CT from 06/29/2021. FINDINGS: Brain: Cerebral volume within normal limits. No significant cerebral white matter disease for age. No abnormal foci of restricted diffusion to suggest acute or subacute ischemia. Gray-white matter differentiation maintained. No encephalomalacia to suggest chronic cortical infarction. No foci of susceptibility artifact to suggest acute or chronic intracranial hemorrhage. No mass lesion, midline shift or mass effect. No hydrocephalus or extra-axial fluid collection. Pituitary gland suprasellar region normal. Midline  structures intact. Vascular: Major intracranial vascular flow voids are maintained. Skull and upper cervical spine: Craniocervical junction within normal limits. Bone marrow signal intensity normal. No acute scalp soft tissue abnormality. Sinuses/Orbits: Globes and orbital soft tissues within normal limits. Paranasal sinuses are largely clear. No mastoid effusion. Inner ear structures grossly normal. Other: None. IMPRESSION: Normal brain MRI for age. No acute intracranial abnormality identified. Electronically Signed   By: Rise Mu M.D.   On: 06/30/2021 00:46   CT HEAD CODE STROKE WO CONTRAST  Result Date: 06/29/2021 CLINICAL DATA:  Code stroke. Neuro deficit, acute, stroke suspected. EXAM: CT HEAD WITHOUT CONTRAST TECHNIQUE: Contiguous axial images were obtained from the base of the skull through the vertex without intravenous contrast. COMPARISON:  Head CT 09/27/2013.  CT angiogram head/neck 11/15/2018. FINDINGS: Brain: Cerebral volume is normal. There is no acute intracranial hemorrhage. No demarcated cortical infarct. No extra-axial fluid collection. No evidence of an intracranial mass. No midline shift. Partially empty sella turcica. Vascular: No hyperdense vessel.  Atherosclerotic calcifications. Skull: Normal. Negative for fracture or focal lesion. Sinuses/Orbits: Visualized orbits show no acute finding. Mild bilateral ethmoid and maxillary sinus mucosal thickening at the imaged levels ASPECTS [facility] Stroke Program Early CT Score) - Ganglionic level infarction (caudate, lentiform nuclei, internal capsule, insula, M1-M3 cortex): 7 - Supraganglionic infarction (M4-M6 cortex): 3 Total score (0-10 with 10 being normal): 10 These results were communicated to Dr. Wilford Corner at 8:24 pmon 7/31/2022by text page via the [facility] messaging system. IMPRESSION: No evidence of acute intracranial abnormality.  ASPECTS is 10. Partially empty sella turcica. This finding is very commonly incidental, but can be  associated with idiopathic intracranial hypertension. Mild bilateral ethmoid and maxillary sinus mucosal thickening at the imaged levels. Electronically Signed   By: Jackey Loge DO   On: 06/29/2021 20:25    Procedures Procedures  Medications Ordered in the ED Medications  aspirin EC tablet 81 mg (81 mg Oral Given 06/30/21 1016)  rosuvastatin (CRESTOR) tablet 40 mg (has no administration in time range)  pantoprazole (PROTONIX) EC tablet 40 mg (40  mg Oral Given 06/30/21 1016)  gabapentin (NEURONTIN) capsule 300 mg (300 mg Oral Not Given 06/30/21 1016)  enoxaparin (LOVENOX) injection 40 mg (40 mg Subcutaneous Given 06/30/21 1018)  ibuprofen (ADVIL) tablet 400 mg (has no administration in time range)  senna-docusate (Senokot-S) tablet 1 tablet (has no administration in time range)  ondansetron (ZOFRAN) tablet 4 mg ( Oral See Alternative 06/30/21 1014)    Or  ondansetron (ZOFRAN) injection 4 mg (4 mg Intravenous Given 06/30/21 1014)  glipiZIDE (GLUCOTROL XL) 24 hr tablet 10 mg (has no administration in time range)  insulin aspart (novoLOG) injection 0-9 Units (1 Units Subcutaneous Given 06/30/21 0752)  linagliptin (TRADJENTA) tablet 5 mg (5 mg Oral Given 06/30/21 1016)  carvedilol (COREG) tablet 25 mg (25 mg Oral Given 06/30/21 0533)  potassium chloride SA (KLOR-CON) CR tablet 40 mEq (40 mEq Oral Given 06/30/21 0752)  amLODipine (NORVASC) tablet 10 mg (10 mg Oral Given 06/30/21 1016)  labetalol (NORMODYNE) injection 10 mg (10 mg Intravenous Given 06/29/21 2042)  ketorolac (TORADOL) 30 MG/ML injection 30 mg (30 mg Intravenous Given 06/29/21 2305)     MDM Rules/Calculators/A&P MDM Patient with possible facial droop, remainder of initial neuro exam is difficult due to pain. Taken to CT with Neurology.   ED Course  I have reviewed the triage vital signs and the nursing notes.  Pertinent labs & imaging results that were available during my care of the patient were reviewed by me and considered in my medical  decision making (see chart for details).  Clinical Course as of 06/30/21 1106  Sun Jun 29, 2021  2037 CBC with mild decrease in WBC and Hgb from recent baseline. Diff is normal. I-stat with borderline low K, coags normal. Preg is neg.  [CS]  2038 CT head is negative. Dr. Wilford Corner, Neurology, will send for MRI.  [CS]  2047 CMP, CK are normal.  [CS]  2047 EtOH neg.  [CS]  2117 UA is neg.  [CS]  2329 Care of the patient signed out to Dr. Blinda Leatherwood at the change of shift pending MRI and final Neuro recommendations.  [CS]    Clinical Course User Index [CS] Pollyann Savoy, MD    Final Clinical Impression(s) / ED Diagnoses Final diagnoses:  None    Rx / DC Orders ED Discharge Orders     None        Pollyann Savoy, MD 06/30/21 1106

## 2021-06-30 DIAGNOSIS — E119 Type 2 diabetes mellitus without complications: Secondary | ICD-10-CM

## 2021-06-30 DIAGNOSIS — I10 Essential (primary) hypertension: Secondary | ICD-10-CM | POA: Diagnosis present

## 2021-06-30 LAB — URINALYSIS, ROUTINE W REFLEX MICROSCOPIC
Bilirubin Urine: NEGATIVE
Glucose, UA: NEGATIVE mg/dL
Hgb urine dipstick: NEGATIVE
Ketones, ur: NEGATIVE mg/dL
Leukocytes,Ua: NEGATIVE
Nitrite: NEGATIVE
Protein, ur: NEGATIVE mg/dL
Specific Gravity, Urine: 1.015 (ref 1.005–1.030)
pH: 6 (ref 5.0–8.0)

## 2021-06-30 LAB — BASIC METABOLIC PANEL
Anion gap: 10 (ref 5–15)
BUN: 8 mg/dL (ref 6–20)
CO2: 25 mmol/L (ref 22–32)
Calcium: 8.9 mg/dL (ref 8.9–10.3)
Chloride: 107 mmol/L (ref 98–111)
Creatinine, Ser: 0.69 mg/dL (ref 0.44–1.00)
GFR, Estimated: >60 mL/min (ref >60)
Glucose, Bld: 111 mg/dL — ABNORMAL HIGH (ref 70–99)
Potassium: 3.2 mmol/L — ABNORMAL LOW (ref 3.5–5.1)
Sodium: 142 mmol/L (ref 135–145)

## 2021-06-30 LAB — HEMOGLOBIN A1C
Hgb A1c MFr Bld: 7 % — ABNORMAL HIGH (ref 4.8–5.6)
Mean Plasma Glucose: 154.2 mg/dL

## 2021-06-30 LAB — HIV ANTIBODY (ROUTINE TESTING W REFLEX): HIV Screen 4th Generation wRfx: NONREACTIVE

## 2021-06-30 LAB — CBG MONITORING, ED
Glucose-Capillary: 136 mg/dL — ABNORMAL HIGH (ref 70–99)
Glucose-Capillary: 139 mg/dL — ABNORMAL HIGH (ref 70–99)
Glucose-Capillary: 68 mg/dL — ABNORMAL LOW (ref 70–99)

## 2021-06-30 LAB — GLUCOSE, CAPILLARY: Glucose-Capillary: 80 mg/dL (ref 70–99)

## 2021-06-30 LAB — MAGNESIUM: Magnesium: 2 mg/dL (ref 1.7–2.4)

## 2021-06-30 MED ORDER — AMLODIPINE BESYLATE 5 MG PO TABS
10.0000 mg | ORAL_TABLET | Freq: Every day | ORAL | Status: DC
  Administered 2021-06-30: 10 mg via ORAL
  Filled 2021-06-30: qty 2

## 2021-06-30 MED ORDER — ASPIRIN EC 81 MG PO TBEC
81.0000 mg | DELAYED_RELEASE_TABLET | Freq: Every day | ORAL | Status: DC
  Administered 2021-06-30 – 2021-07-09 (×10): 81 mg via ORAL
  Filled 2021-06-30 (×10): qty 1

## 2021-06-30 MED ORDER — IBUPROFEN 400 MG PO TABS
400.0000 mg | ORAL_TABLET | Freq: Four times a day (QID) | ORAL | Status: DC | PRN
  Administered 2021-06-30: 400 mg via ORAL
  Filled 2021-06-30: qty 1

## 2021-06-30 MED ORDER — POTASSIUM CHLORIDE CRYS ER 20 MEQ PO TBCR
40.0000 meq | EXTENDED_RELEASE_TABLET | Freq: Four times a day (QID) | ORAL | Status: AC
  Administered 2021-06-30 (×2): 40 meq via ORAL
  Filled 2021-06-30 (×2): qty 2

## 2021-06-30 MED ORDER — SENNOSIDES-DOCUSATE SODIUM 8.6-50 MG PO TABS
1.0000 | ORAL_TABLET | Freq: Every evening | ORAL | Status: DC | PRN
  Administered 2021-07-06: 1 via ORAL
  Filled 2021-06-30: qty 1

## 2021-06-30 MED ORDER — ENOXAPARIN SODIUM 40 MG/0.4ML IJ SOSY
40.0000 mg | PREFILLED_SYRINGE | Freq: Every day | INTRAMUSCULAR | Status: DC
  Administered 2021-06-30 – 2021-07-09 (×10): 40 mg via SUBCUTANEOUS
  Filled 2021-06-30 (×10): qty 0.4

## 2021-06-30 MED ORDER — ONDANSETRON HCL 4 MG/2ML IJ SOLN
4.0000 mg | Freq: Four times a day (QID) | INTRAMUSCULAR | Status: DC | PRN
  Administered 2021-06-30 – 2021-07-03 (×5): 4 mg via INTRAVENOUS
  Filled 2021-06-30 (×5): qty 2

## 2021-06-30 MED ORDER — LINAGLIPTIN 5 MG PO TABS
5.0000 mg | ORAL_TABLET | Freq: Every day | ORAL | Status: DC
  Administered 2021-06-30 – 2021-07-02 (×3): 5 mg via ORAL
  Filled 2021-06-30 (×3): qty 1

## 2021-06-30 MED ORDER — CARVEDILOL 25 MG PO TABS
25.0000 mg | ORAL_TABLET | Freq: Two times a day (BID) | ORAL | Status: DC
  Administered 2021-06-30 – 2021-07-01 (×4): 25 mg via ORAL
  Filled 2021-06-30: qty 1
  Filled 2021-06-30: qty 2
  Filled 2021-06-30: qty 8
  Filled 2021-06-30: qty 1

## 2021-06-30 MED ORDER — GLIPIZIDE ER 10 MG PO TB24
10.0000 mg | ORAL_TABLET | Freq: Every day | ORAL | Status: DC
  Administered 2021-06-30 – 2021-07-02 (×3): 10 mg via ORAL
  Filled 2021-06-30 (×4): qty 1

## 2021-06-30 MED ORDER — PANTOPRAZOLE SODIUM 40 MG PO TBEC
40.0000 mg | DELAYED_RELEASE_TABLET | Freq: Every day | ORAL | Status: DC
  Administered 2021-06-30 – 2021-07-09 (×10): 40 mg via ORAL
  Filled 2021-06-30 (×10): qty 1

## 2021-06-30 MED ORDER — INSULIN ASPART 100 UNIT/ML IJ SOLN: 0.0000 [IU] | Freq: Every day | INTRAMUSCULAR | Status: DC

## 2021-06-30 MED ORDER — LINAGLIPTIN 5 MG PO TABS: 5.0000 mg | ORAL_TABLET | Freq: Every day | ORAL | Status: DC

## 2021-06-30 MED ORDER — CARVEDILOL 12.5 MG PO TABS: 25.0000 mg | ORAL_TABLET | Freq: Two times a day (BID) | ORAL | Status: DC

## 2021-06-30 MED ORDER — AMLODIPINE BESYLATE 5 MG PO TABS
5.0000 mg | ORAL_TABLET | Freq: Every day | ORAL | Status: DC
  Administered 2021-07-01: 5 mg via ORAL
  Filled 2021-06-30: qty 1

## 2021-06-30 MED ORDER — ONDANSETRON HCL 4 MG PO TABS
4.0000 mg | ORAL_TABLET | Freq: Four times a day (QID) | ORAL | Status: DC | PRN
  Administered 2021-07-01: 4 mg via ORAL
  Filled 2021-06-30 (×2): qty 1

## 2021-06-30 MED ORDER — ROSUVASTATIN CALCIUM 20 MG PO TABS
40.0000 mg | ORAL_TABLET | Freq: Every day | ORAL | Status: DC
  Administered 2021-06-30 – 2021-07-01 (×2): 40 mg via ORAL
  Filled 2021-06-30 (×2): qty 2

## 2021-06-30 MED ORDER — INSULIN ASPART 100 UNIT/ML IJ SOLN
0.0000 [IU] | Freq: Three times a day (TID) | INTRAMUSCULAR | Status: DC
  Administered 2021-06-30 (×2): 1 [IU] via SUBCUTANEOUS
  Administered 2021-07-03: 5 [IU] via SUBCUTANEOUS
  Administered 2021-07-03: 2 [IU] via SUBCUTANEOUS
  Administered 2021-07-04 (×3): 3 [IU] via SUBCUTANEOUS

## 2021-06-30 MED ORDER — GABAPENTIN 300 MG PO CAPS
300.0000 mg | ORAL_CAPSULE | Freq: Three times a day (TID) | ORAL | Status: DC
  Filled 2021-06-30 (×2): qty 1

## 2021-06-30 NOTE — ED Notes (Signed)
Placed Breakfast Order 

## 2021-06-30 NOTE — Progress Notes (Addendum)
Subjective:  Patient stated she felt better than when she came to the ED. She was having a nausea/vomiting spell when we went to examine her. She denied shortness of breath, chest pain abdominal pain, diarrhea, constipation. She did endorse urgency in urination that causes her to soil her pants. Apart from feeling of nausea she denied any acute concerns.   Objective:  Vital signs in last 24 hours: Vitals:   06/30/21 1200 06/30/21 1215 06/30/21 1255 06/30/21 1323  BP: 96/66 107/73 103/70 127/90  Pulse: 65 67 64 78  Resp: 14 15 17 13   Temp:      TempSrc:      SpO2: 97% 98% 97% 99%  Weight:      Height:       Physical Exam Constitutional:      Appearance: Normal appearance.  HENT:     Head: Normocephalic and atraumatic.     Right Ear: External ear normal.     Left Ear: External ear normal.     Mouth/Throat:     Mouth: Mucous membranes are moist.     Pharynx: Oropharynx is clear. No posterior oropharyngeal erythema.  Eyes:     Extraocular Movements: Extraocular movements intact.     Conjunctiva/sclera: Conjunctivae normal.     Pupils: Pupils are equal, round, and reactive to light.  Cardiovascular:     Rate and Rhythm: Normal rate and regular rhythm.     Pulses: Normal pulses.     Heart sounds: Normal heart sounds.  Pulmonary:     Effort: Pulmonary effort is normal.     Breath sounds: Normal breath sounds.  Abdominal:     General: Bowel sounds are normal.     Palpations: Abdomen is soft.  Musculoskeletal:        General: Normal range of motion.     Cervical back: Normal range of motion and neck supple.     Right lower leg: Edema (+1) present.     Left lower leg: Edema (+1) present.  Skin:    General: Skin is warm and dry.     Capillary Refill: Capillary refill takes less than 2 seconds.     Coloration: Skin is not jaundiced.  Neurological:     General: No focal deficit present.     Mental Status: She is alert and oriented to person, place, and time.     Cranial  Nerves: No cranial nerve deficit.     Motor: Weakness (ULE=4/5, URE=4/5, LRE=4/5, LLE=4/5) present.  Psychiatric:        Mood and Affect: Mood normal.        Behavior: Behavior normal.     Assessment/Plan:  Active Problems:   Severe hypertension  Severe Hypertension Initial presentation was concerning for CVA however MRI showed no evidence of acute intracranial abnormality. Patient reports improvement in symptoms since presentation to ED and with gradual BP control. Patient states that she is adherent to home medications for BP including carvedilol, isosorbide mononitrate, lisinopril, HCTZ, and amlodipine. She states that when she does check her BP at home that it runs in the 150s+. MRI and CT scan rule out acute stroke on admission. Patient had history of PRES syndrome in 2016.  Plan: -Continue current medications (Coreg 25 mg BID, Norvasc 5 mg) -Daily BMP -Continue to keep blood pressure sys<180.  COVID 19 infection Patient had incidental COVID infection during this ED visit. She did not endorse any symptoms initially but has symptoms that are consistent with a COVID infection including congestion,  nausea, and vomiting.   Plan: -Continue to monitor patient for symptom development -Symptom management  History of diabetes mellitus/peripheral neuropathy Patient has history of diabetes mellitus but denies home management of diabetes and is unsure of last HbA1c though states that she remembers it being high. She has symptoms of peripheral neuropathy.  Plan: - Check HbA1c  -A1c is 7.0 - Begin glipizide 10 mg daily - Begin novolog SS insulin three times daily with meals - Begin linagliptin 5 mg by mouth daily -Gabapentin 300 mg TID  History of DVT Patient has a history of DVT and is currently  on Lovenox 40 mg daily prophylaxis.  Plan: -Continue Lovenox 40 mg daily  History of Stroke Patient has history of PRES syndrome in 2016. She states this appears similar to that feeling.  MRI and CT for stroke have been negative so far.   Plan: -Continue Rosuvastatin 40 mg daily  Urinary urgency Patient complained of urinary urgency that has been recent where she is soiling her pants instead of making it to the bathroom. She stated it started 06/28/21.   Plan: -Continue to monitor -Urinalysis -Post void residual volume - Prior to Admission Living Arrangement: Anticipated Discharge Location: Barriers to Discharge: Dispo: Anticipated discharge in approximately 2 day(s).   Gwenevere Abbot, MD 06/30/2021, 2:57 PM

## 2021-06-30 NOTE — Plan of Care (Signed)

## 2021-06-30 NOTE — H&P (Signed)
Date: 06/30/2021               Patient Name:  Mary Cuevas MRN: [id number]  DOB: 05/05/1961 Age / Sex: 60 y.o., female   PCP: Moishe Spiceuhl, Evern BioLaura J, MD         Medical Service: Internal Medicine Teaching Service         Attending Physician: Dr. Gilda CreasePollina, Christopher J, *    First Contact: Dr. Welton FlakesKhan Pager: [phone]  Second Contact: Dr. Austin MilesJinwala Pager: [phone]       After Hours (After 5p/  First Contact Pager: [phone]  weekends / holidays): Second Contact Pager: [phone]   SUBJECTIVE   Chief Complaint: weakness  History of Present Illness: Mary Cuevas is a 60 y.o. female who presented to the ED as code stroke due to new facial droop and extremity weakness. She was found to be significantly hypertensive at 204/120 at that time. She states that for the past few days she has been feeling overall unwell with bilateral lower extremity pain. She notes that she has had increased trouble with gait and has been stumbling more. She has not had any headache, changes in baseline blurry vision, trouble swallowing, chest pain; she has had new polyuria with dysuria however has not had trouble with bowel incontinence. She complains of weakness in her arms as well though this seems to be more chronic in nature. She endorses some tingling in her hands for the last 1-2 months though admits she has peripheral neuropathy. She does state that she has started feeling better as her BP has come down.  She was incidentally found to have COVID-19. Code status: DNR/DNI.  ED Course:  Meds:  No outpatient medications have been marked as taking for the 06/29/21 encounter [facility](Hospital Encounter).   No current facility-administered medications on file prior to encounter.   Current Outpatient Medications on File Prior to Encounter  Medication Sig Dispense Refill   amLODipine (NORVASC) 10 MG tablet Take 10 mg by mouth daily.     aspirin EC 81 MG tablet Take 81 mg by mouth daily.     Biotin 1000 MCG tablet Take 1,000 mcg by  mouth daily.     carvedilol (COREG) 25 MG tablet Take 1 tablet (25 mg total) by mouth 2 (two) times daily with a meal. 60 tablet 5   clobetasol cream (TEMOVATE) 0.05 % Apply 1 application topically 2 (two) times daily.     DULoxetine (CYMBALTA) 30 MG capsule Take 30 mg by mouth daily.     gabapentin (NEURONTIN) 300 MG capsule Take 300 mg by mouth 3 (three) times daily.     glipiZIDE (GLUCOTROL XL) 10 MG 24 hr tablet Take 10 mg by mouth daily with breakfast.     hydrochlorothiazide (HYDRODIURIL) 25 MG tablet Take 25 mg by mouth daily.     hydroxychloroquine (PLAQUENIL) 200 MG tablet Take 200 mg by mouth 2 (two) times daily.     ibuprofen (ADVIL,MOTRIN) 600 MG tablet Take 1 tablet (600 mg total) by mouth every 8 (eight) hours as needed. 30 tablet 0   isosorbide mononitrate (IMDUR) 30 MG 24 hr tablet Take 1 tablet (30 mg total) by mouth daily. 30 tablet 6   lisinopril (PRINIVIL,ZESTRIL) 40 MG tablet Take 40 mg by mouth daily.     meclizine (ANTIVERT) 25 MG tablet Take 1 tablet (25 mg total) by mouth 3 (three) times daily as needed for dizziness. 10 tablet 0   nitroGLYCERIN (NITROSTAT) 0.4 MG SL tablet Place 0.4 mg under  the tongue every 5 (five) minutes as needed for chest pain.     omeprazole (PRILOSEC) 20 MG capsule Take 1 capsule (20 mg total) by mouth 2 (two) times daily before a meal. 60 capsule 2   ondansetron (ZOFRAN ODT) 4 MG disintegrating tablet Take 1 tablet (4 mg total) by mouth every 8 (eight) hours as needed for nausea or vomiting. 10 tablet 0   rosuvastatin (CRESTOR) 40 MG tablet Take 40 mg by mouth at bedtime.      sucralfate (CARAFATE) 1 g tablet Take 1 tablet (1 g total) by mouth 4 (four) times daily. 120 tablet 1   triamcinolone cream (KENALOG) 0.1 % Apply 1 application topically 2 (two) times daily.     Vitamin D, Ergocalciferol, (DRISDOL) 50000 units CAPS capsule Take 50,000 Units by mouth every 7 (seven) days.       Past Medical History:  Diagnosis Date   Chest pain     Diabetes mellitus without complication (HCC)    DVT (deep vein thrombosis) in pregnancy    Ectopic pregnancy    Hypertension    Stroke (cerebrum) [facility])     Past Surgical History:  Procedure Laterality Date   ABDOMINAL HYSTERECTOMY     CARDIAC CATHETERIZATION N/A 11/18/2016   Procedure: Left Heart Cath and Coronary Angiography;  Surgeon: Yvonne Kendall, MD;  Location: [facility] INVASIVE CV LAB;  Service: Cardiovascular;  Laterality: N/A;   KNEE ARTHROSCOPY      Social:  Lives With: Occupation: Support: Level of Function: PCP: Substances:  Family History: Patient reports extensive family history of hypertension including siblings, parents, grandparents, and cousins.  Allergies: Allergies as of 06/29/2021 - Review Complete 06/29/2021  Allergen Reaction Noted   Demerol [meperidine] Nausea And Vomiting 10/25/2016   Vicodin [hydrocodone-acetaminophen] Nausea And Vomiting 10/25/2016   Dilaudid [hydromorphone hcl] Nausea And Vomiting and Other (See Comments) 10/25/2016    Review of Systems: A complete ROS was negative except as per HPI.   OBJECTIVE:   Physical Exam: Blood pressure (!) 158/87, pulse 82, temperature 98.6 F (37 C), temperature source Oral, resp. rate 16, height 5\' 3"  (1.6 m), weight 67.6 kg, SpO2 95 %.  Constitutional: Well-appearing, laying in bed, in no acute distress HENT: Normocephalic atraumatic, mucous membranes moist Eyes: Conjunctiva non-erythematous Neck: Supple Cardiovascular: Regular rate and rhythm, no m/r/g Pulmonary/Chest: Normal work of breathing on room air, lungs clear to auscultation bilaterally, patient winces with deep breaths Abdominal: Soft, non-tender, non-distended MSK: Normal bulk and tone Neurological: alert & oriented x 3 Mental Status: Patient is awake, alert, oriented x3 No signs of aphasia or neglect Motor: Unequal effort thorughout, at Least 3/5 RUE 4/5 LUE, 3/5 RLE 4/5 LLE Sensory: Sensation is grossly intact bilateral UEs &  LEs Plantars: Toes are downgoing bilaterally. Skin: warm and dry Psych: Normal mood and affect  Labs: CBC    Component Value Date/Time   WBC 3.9 (L) 06/29/2021 2010   RBC 4.34 06/29/2021 2010   HGB 12.6 06/29/2021 2016   HGB CANCELED 11/10/2016 1501   HCT 37.0 06/29/2021 2016   HCT CANCELED 11/10/2016 1501   PLT 202 06/29/2021 2010   PLT CANCELED 11/10/2016 1501   MCV 81.8 06/29/2021 2010   MCV 80 10/04/2013 1343   MCH 27.4 06/29/2021 2010   MCHC 33.5 06/29/2021 2010   RDW 13.7 06/29/2021 2010   RDW 13.4 10/04/2013 1343   LYMPHSABS 1.3 06/29/2021 2010   LYMPHSABS CANCELED 11/10/2016 1501   LYMPHSABS 2.7 09/27/2013 1948   MONOABS 0.5 06/29/2021  2010   MONOABS 0.5 09/27/2013 1948   EOSABS 0.1 06/29/2021 2010   EOSABS CANCELED 11/10/2016 1501   EOSABS 0.2 09/27/2013 1948   BASOSABS 0.0 06/29/2021 2010   BASOSABS CANCELED 11/10/2016 1501   BASOSABS 0.1 09/27/2013 1948     CMP     Component Value Date/Time   NA 141 06/29/2021 2016   NA 141 11/10/2016 1501   NA 138 10/04/2013 1343   K 3.2 (L) 06/29/2021 2016   K 3.8 10/04/2013 1343   CL 106 06/29/2021 2016   CL 107 10/04/2013 1343   CO2 23 06/29/2021 2010   CO2 26 10/04/2013 1343   GLUCOSE 105 (H) 06/29/2021 2016   GLUCOSE 149 (H) 10/04/2013 1343   BUN 11 06/29/2021 2016   BUN 13 11/10/2016 1501   BUN 21 (H) 10/04/2013 1343   CREATININE 0.70 06/29/2021 2016   CREATININE 0.91 10/04/2013 1343   CALCIUM 8.8 (L) 06/29/2021 2010   CALCIUM 10.2 (H) 10/04/2013 1343   PROT 7.0 06/29/2021 2010   PROT 9.0 (H) 10/04/2013 1343   ALBUMIN 3.7 06/29/2021 2010   ALBUMIN 4.4 10/04/2013 1343   AST 18 06/29/2021 2010   AST 50 (H) 10/04/2013 1343   ALT 10 06/29/2021 2010   ALT 64 10/04/2013 1343   ALKPHOS 85 06/29/2021 2010   ALKPHOS 170 (H) 10/04/2013 1343   BILITOT 0.8 06/29/2021 2010   BILITOT 0.3 10/04/2013 1343   GFRNONAA >60 06/29/2021 2010   GFRNONAA >60 10/04/2013 1343   GFRAA >60 11/15/2018 1156   GFRAA >60  10/04/2013 1343    Imaging: MR BRAIN WO CONTRAST  Result Date: 06/30/2021 CLINICAL DATA:  Initial evaluation for acute left-sided facial droop. EXAM: MRI HEAD WITHOUT CONTRAST TECHNIQUE: Multiplanar, multiecho pulse sequences of the brain and surrounding structures were obtained without intravenous contrast. COMPARISON:  CT from 06/29/2021. FINDINGS: Brain: Cerebral volume within normal limits. No significant cerebral white matter disease for age. No abnormal foci of restricted diffusion to suggest acute or subacute ischemia. Gray-white matter differentiation maintained. No encephalomalacia to suggest chronic cortical infarction. No foci of susceptibility artifact to suggest acute or chronic intracranial hemorrhage. No mass lesion, midline shift or mass effect. No hydrocephalus or extra-axial fluid collection. Pituitary gland suprasellar region normal. Midline structures intact. Vascular: Major intracranial vascular flow voids are maintained. Skull and upper cervical spine: Craniocervical junction within normal limits. Bone marrow signal intensity normal. No acute scalp soft tissue abnormality. Sinuses/Orbits: Globes and orbital soft tissues within normal limits. Paranasal sinuses are largely clear. No mastoid effusion. Inner ear structures grossly normal. Other: None. IMPRESSION: Normal brain MRI for age. No acute intracranial abnormality identified. Electronically Signed   By: Rise Mu M.D.   On: 06/30/2021 00:46   CT HEAD CODE STROKE WO CONTRAST  Result Date: 06/29/2021 CLINICAL DATA:  Code stroke. Neuro deficit, acute, stroke suspected. EXAM: CT HEAD WITHOUT CONTRAST TECHNIQUE: Contiguous axial images were obtained from the base of the skull through the vertex without intravenous contrast. COMPARISON:  Head CT 09/27/2013.  CT angiogram head/neck 11/15/2018. FINDINGS: Brain: Cerebral volume is normal. There is no acute intracranial hemorrhage. No demarcated cortical infarct. No extra-axial  fluid collection. No evidence of an intracranial mass. No midline shift. Partially empty sella turcica. Vascular: No hyperdense vessel.  Atherosclerotic calcifications. Skull: Normal. Negative for fracture or focal lesion. Sinuses/Orbits: Visualized orbits show no acute finding. Mild bilateral ethmoid and maxillary sinus mucosal thickening at the imaged levels ASPECTS [facility] Stroke Program Early CT Score) - Ganglionic level  infarction (caudate, lentiform nuclei, internal capsule, insula, M1-M3 cortex): 7 - Supraganglionic infarction (M4-M6 cortex): 3 Total score (0-10 with 10 being normal): 10 These results were communicated to Dr. Wilford Corner at 8:24 pmon 7/31/2022by text page via the [facility] messaging system. IMPRESSION: No evidence of acute intracranial abnormality.  ASPECTS is 10. Partially empty sella turcica. This finding is very commonly incidental, but can be associated with idiopathic intracranial hypertension. Mild bilateral ethmoid and maxillary sinus mucosal thickening at the imaged levels. Electronically Signed   By: Jackey Loge DO   On: 06/29/2021 20:25    EKG: personally reviewed my interpretation is sinus rhythm.   ASSESSMENT & PLAN:    Assessment & Plan by Problem: Active Problems:   * No active hospital problems. *   Mary Cuevas is a 60 y.o. with pertinent PMH of DM, HTN, DVT, CVA, peripheral neuropathy who presented with weakness and new L sided facial droop and admitted for hypertensive emergency on hospital day 0.  #Hypertensive urgency Initial presentation was concerning for CVA however MRI showed no evidence of acute intracranial abnormality. Patient reports improvement in symptoms since presentation to ED and with gradual BP control. Patient states that she is adherent to home medications for BP including carvedilol, isosorbide mononitrate, lisinopril, HCTZ, and amlodipine. She states that when she does check her BP at home that it runs in the 150s+.  - Check BMP,  magnesium - Labetalol 10 mg IV given at 2042 with appropriate decrease in BP.  - Continue home carvedilol 25 mg twice daily - Per neurology consult, goal BP is around 180 systolic  #COVID-19 Infection Patient incidentally found to be COVID-19 positive though she denies any chest pain, shortness of breath, orthopnea.  #History of CVA - MRI was not concerning for acute intracranial abnormality - Continue home aspirin 81 mg daily - Rosuvastatin 40 mg daily  #History of DVT - Lovenox 40 mg daily for DVT prophylaxis  #Peripheral neuropathy Patient endorses history of peripheral neuropathy and notes that she has been experiencing tingling in her hands. She does not recall her last HbA1c however admits that she knows that it was high. - Gabapentin 300 mg three times daily  #History of diabetes mellitus Patient denies home management of diabetes and is unsure of last HbA1c though states that she remembers it being high.  - Check HbA1c - Begin glipizide 10 mg daily - Begin novolog SS insulin three times daily with meals - Begin linagliptin 5 mg by mouth daily   Diet: Heart Healthy VTE: Enoxaparin IVF: IV NS 75 cc/hr Code: DNR/DNI  Prior to Admission Living Arrangement: Home Anticipated Discharge Location:  Home  Dispo: Admit patient to Observation with expected length of stay less than 2 midnights.  Signed: Cleotilde Neer, MD Internal Medicine Resident PGY-3 Pager: [phone]  06/30/2021, 2:19 AM

## 2021-06-30 NOTE — ED Notes (Signed)
Eating lunch. Speaking with daughter Karel Jarvis by phone.

## 2021-06-30 NOTE — ED Notes (Signed)
Sleeping/resing, arousable to voice, interactive, calm, NAD.

## 2021-06-30 NOTE — ED Notes (Signed)
Patient transported to MRI 

## 2021-06-30 NOTE — ED Provider Notes (Addendum)
Received patient as signout from Dr. Bernette Mayers.  MRI pending at time of signout.  Patient has been for her MRI and there is no evidence of stroke.  Discussed case further with Dr. Wilford Corner, neurologic symptoms likely multifactorial.  Suspect related to hypertensive urgency, Dr. Marney Doctor recommends observation to medicine for blood pressure control.  No further neurologic work-up necessary.  Patient is found to be COVID-positive.  No hypoxia noted.  She has had 2 vaccinations and a booster.   Gilda Crease, MD 06/30/21 0112    Gilda Crease, MD 06/30/21 [phone]

## 2021-06-30 NOTE — ED Notes (Signed)
Admitting team arriving to [facility]. Pt alert, NAD, calm, interactive speaking on phone.

## 2021-06-30 NOTE — ED Notes (Signed)
C/o sudden onset of nausea. zofran given.

## 2021-06-30 NOTE — ED Notes (Signed)
Checked patient cbg it was 41 notified RN of blood sugar patient is resting with call bell in reach

## 2021-07-01 DIAGNOSIS — R29810 Facial weakness: Secondary | ICD-10-CM | POA: Diagnosis present

## 2021-07-01 DIAGNOSIS — I10 Essential (primary) hypertension: Secondary | ICD-10-CM | POA: Diagnosis present

## 2021-07-01 DIAGNOSIS — Z66 Do not resuscitate: Secondary | ICD-10-CM | POA: Diagnosis present

## 2021-07-01 DIAGNOSIS — F419 Anxiety disorder, unspecified: Secondary | ICD-10-CM | POA: Diagnosis present

## 2021-07-01 DIAGNOSIS — I161 Hypertensive emergency: Secondary | ICD-10-CM | POA: Diagnosis present

## 2021-07-01 DIAGNOSIS — Z86718 Personal history of other venous thrombosis and embolism: Secondary | ICD-10-CM | POA: Diagnosis not present

## 2021-07-01 DIAGNOSIS — Z79899 Other long term (current) drug therapy: Secondary | ICD-10-CM | POA: Diagnosis not present

## 2021-07-01 DIAGNOSIS — I160 Hypertensive urgency: Secondary | ICD-10-CM | POA: Diagnosis present

## 2021-07-01 DIAGNOSIS — Z90710 Acquired absence of both cervix and uterus: Secondary | ICD-10-CM | POA: Diagnosis not present

## 2021-07-01 DIAGNOSIS — I959 Hypotension, unspecified: Secondary | ICD-10-CM | POA: Diagnosis not present

## 2021-07-01 DIAGNOSIS — J1282 Pneumonia due to coronavirus disease 2019: Secondary | ICD-10-CM | POA: Diagnosis present

## 2021-07-01 DIAGNOSIS — Z794 Long term (current) use of insulin: Secondary | ICD-10-CM | POA: Diagnosis not present

## 2021-07-01 DIAGNOSIS — Z8673 Personal history of transient ischemic attack (TIA), and cerebral infarction without residual deficits: Secondary | ICD-10-CM | POA: Diagnosis not present

## 2021-07-01 DIAGNOSIS — E1142 Type 2 diabetes mellitus with diabetic polyneuropathy: Secondary | ICD-10-CM | POA: Diagnosis present

## 2021-07-01 DIAGNOSIS — U071 COVID-19: Secondary | ICD-10-CM | POA: Diagnosis present

## 2021-07-01 DIAGNOSIS — Z885 Allergy status to narcotic agent status: Secondary | ICD-10-CM | POA: Diagnosis not present

## 2021-07-01 DIAGNOSIS — Z8249 Family history of ischemic heart disease and other diseases of the circulatory system: Secondary | ICD-10-CM | POA: Diagnosis not present

## 2021-07-01 DIAGNOSIS — R3915 Urgency of urination: Secondary | ICD-10-CM | POA: Diagnosis present

## 2021-07-01 DIAGNOSIS — Z888 Allergy status to other drugs, medicaments and biological substances status: Secondary | ICD-10-CM | POA: Diagnosis not present

## 2021-07-01 DIAGNOSIS — E11649 Type 2 diabetes mellitus with hypoglycemia without coma: Secondary | ICD-10-CM | POA: Diagnosis not present

## 2021-07-01 LAB — BASIC METABOLIC PANEL
Anion gap: 9 (ref 5–15)
BUN: 16 mg/dL (ref 6–20)
CO2: 23 mmol/L (ref 22–32)
Calcium: 9 mg/dL (ref 8.9–10.3)
Chloride: 106 mmol/L (ref 98–111)
Creatinine, Ser: 1.01 mg/dL — ABNORMAL HIGH (ref 0.44–1.00)
GFR, Estimated: >60 mL/min (ref >60)
Glucose, Bld: 124 mg/dL — ABNORMAL HIGH (ref 70–99)
Potassium: 3.8 mmol/L (ref 3.5–5.1)
Sodium: 138 mmol/L (ref 135–145)

## 2021-07-01 LAB — GLUCOSE, CAPILLARY
Glucose-Capillary: 114 mg/dL — ABNORMAL HIGH (ref 70–99)
Glucose-Capillary: 82 mg/dL (ref 70–99)
Glucose-Capillary: 66 mg/dL — ABNORMAL LOW (ref 70–99)
Glucose-Capillary: 130 mg/dL — ABNORMAL HIGH (ref 70–99)
Glucose-Capillary: 113 mg/dL — ABNORMAL HIGH (ref 70–99)

## 2021-07-01 MED ORDER — DM-GUAIFENESIN ER 30-600 MG PO TB12
1.0000 | ORAL_TABLET | Freq: Two times a day (BID) | ORAL | Status: DC | PRN
  Administered 2021-07-01: 1 via ORAL
  Filled 2021-07-01: qty 1

## 2021-07-01 MED ORDER — ACETAMINOPHEN 325 MG PO TABS
650.0000 mg | ORAL_TABLET | ORAL | Status: DC | PRN
  Administered 2021-07-01 (×3): 650 mg via ORAL
  Filled 2021-07-01 (×3): qty 2

## 2021-07-01 MED ORDER — SODIUM CHLORIDE 0.9 % IV BOLUS
1000.0000 mL | Freq: Once | INTRAVENOUS | Status: AC
  Administered 2021-07-01: 1000 mL via INTRAVENOUS

## 2021-07-01 MED ORDER — SODIUM CHLORIDE 0.9 % IV BOLUS: 1000.0000 mL | Freq: Once | INTRAVENOUS | Status: DC

## 2021-07-01 MED ORDER — NIRMATRELVIR/RITONAVIR (PAXLOVID)TABLET
3.0000 | ORAL_TABLET | Freq: Two times a day (BID) | ORAL | Status: DC
Start: 2021-07-02 — End: 2021-07-05
  Administered 2021-07-02 – 2021-07-05 (×8): 3 via ORAL
  Filled 2021-07-01 (×2): qty 30

## 2021-07-01 MED ORDER — SODIUM CHLORIDE 0.9 % IV SOLN: INTRAVENOUS | Status: AC

## 2021-07-01 NOTE — Plan of Care (Signed)

## 2021-07-01 NOTE — Care Management Obs Status (Signed)
MEDICARE OBSERVATION STATUS NOTIFICATION   Patient Details  Name: Mary Cuevas MRN: [id number] Date of Birth: 12/27/1960   Medicare Observation Status Notification Given:  Yes    Lawerance Sabal, RN 07/01/2021, 2:19 PM

## 2021-07-01 NOTE — Progress Notes (Signed)
Physical Therapy Evaluation Patient Details Name: Mary Cuevas MRN: [id number] DOB: 23-Apr-1961 Today's Date: 07/01/2021   History of Present Illness  Pt is a 59yo female presenting to [facility] ED on 7/31 secondary to weakness and L facial droop. Likely secondary to severe HTN. Found to be +COVID-19 upon admission. Imaging revealed no acute abnormalities. PMH: HTN, DM, prior stroke, PRES, peripheral neuropathy (pt report from H&P).   Clinical Impression  Pt presents with impairments above and problems listed below. Pt required min guard assist for bed mobility and transfers for safety. Pt developed dizziness in standing and had to return to sitting. BP recorded in sitting as 95/65. Further treatment was deferred. BP upon return to supine 99/65. Pt generally weak in all extremities and perseverates on her weakness. Pt was educated on recommendations for SNF but expressed some reluctance to discharge to SNF. If pt refuses, will need max HH services and 24/7 support. We will continue to follow her acutely to promote her independence with functional mobility.    Follow Up Recommendations SNF;Supervision/Assistance - 24 hour (Pt likely to refuse SNF)    Equipment Recommendations  3in1 (PT)    Recommendations for Other Services       Precautions / Restrictions Precautions Precautions: Fall Restrictions Weight Bearing Restrictions: No      Mobility  Bed Mobility Overal bed mobility: Needs Assistance Bed Mobility: Supine to Sit;Sit to Supine     Supine to sit: Min guard Sit to supine: Min guard   General bed mobility comments: Pt requires min guard assist for safety    Transfers Overall transfer level: Needs assistance Equipment used: Rolling walker (2 wheeled) Transfers: Sit to/from Stand Sit to Stand: Min guard         General transfer comment: Pt required min guard assist for safety in transfer; upon standing pt described sensation of dizziness and demonstrated increased  sway. Had pt return to sitting and checked BP (95/65) as she was unable to tolerate prolonged standing. Further mobility deferred. Upon return to supine BP at 99/65.  Ambulation/Gait                Stairs            Wheelchair Mobility    Modified Rankin (Stroke Patients Only)       Balance Overall balance assessment: Needs assistance Sitting-balance support: Single extremity supported;Feet supported Sitting balance-Leahy Scale: Fair     Standing balance support: Bilateral upper extremity supported Standing balance-Leahy Scale: Poor Standing balance comment: Pt required BUE support on RW during standing; pt unable to tolerate prolonged standing secondary to dizziness                             Pertinent Vitals/Pain Pain Assessment: No/denies pain    Home Living Family/patient expects to be discharged to:: Private residence Living Arrangements: Non-relatives/Friends Available Help at Discharge: Available PRN/intermittently;Friend(s) Type of Home: House Home Access: Stairs to enter Entrance Stairs-Rails: Left Entrance Stairs-Number of Steps: 3 Home Layout: One level Home Equipment: Cane - single point;Walker - 2 wheels Additional Comments: Pt living with friend while remodeling mother's house.    Prior Function Level of Independence: Independent               Hand Dominance   Dominant Hand: Left    Extremity/Trunk Assessment   Upper Extremity Assessment Upper Extremity Assessment: Generalized weakness;RUE deficits/detail;LUE deficits/detail RUE Deficits / Details: Gross strength 3+/5 RUE Sensation: WNL  LUE Deficits / Details: Gross strength 3+/5 LUE Sensation: WNL    Lower Extremity Assessment Lower Extremity Assessment: Generalized weakness;RLE deficits/detail;LLE deficits/detail RLE Deficits / Details: strength is grossly 3+/5 RLE Sensation: WNL LLE Deficits / Details: strength is grossly 3+/5 LLE Sensation: WNL     Cervical / Trunk Assessment Cervical / Trunk Assessment: Normal  Communication   Communication: No difficulties  Cognition Arousal/Alertness: Awake/alert Behavior During Therapy: WFL for tasks assessed/performed Overall Cognitive Status: No family/caregiver present to determine baseline cognitive functioning                                 General Comments: Pt perseverating on how weak she feels. Slow to respond to some questions      General Comments      Exercises     Assessment/Plan    PT Assessment Patient needs continued PT services  PT Problem List Decreased strength;Decreased activity tolerance;Decreased balance;Decreased mobility;Decreased coordination;Decreased cognition;Cardiopulmonary status limiting activity       PT Treatment Interventions DME instruction;Gait training;Stair training;Functional mobility training;Therapeutic activities;Therapeutic exercise;Balance training;Patient/family education    PT Goals (Current goals can be found in the Care Plan section)  Acute Rehab PT Goals Patient Stated Goal: To feel stronger PT Goal Formulation: With patient Time For Goal Achievement: 07/15/21 Potential to Achieve Goals: Fair    Frequency Min 3X/week   Barriers to discharge Decreased caregiver support      Co-evaluation               AM-PAC PT "6 Clicks" Mobility  Outcome Measure Help needed turning from your back to your side while in a flat bed without using bedrails?: A Little Help needed moving from lying on your back to sitting on the side of a flat bed without using bedrails?: A Little Help needed moving to and from a bed to a chair (including a wheelchair)?: A Lot Help needed standing up from a chair using your arms (e.g., wheelchair or bedside chair)?: A Little Help needed to walk in hospital room?: A Lot Help needed climbing 3-5 steps with a railing? : A Lot 6 Click Score: 15    End of Session Equipment Utilized During  Treatment: Gait belt Activity Tolerance: Patient limited by fatigue;Treatment limited secondary to medical complications (Comment) (Low BP) Patient left: in bed;with call bell/phone within reach;with bed alarm set Nurse Communication: Mobility status;Other (comment) (Low BP) PT Visit Diagnosis: Muscle weakness (generalized) (M62.81);Unsteadiness on feet (R26.81)    Time: 1412-1440 PT Time Calculation (min) (ACUTE ONLY): 28 min   Charges:   PT Evaluation $PT Eval Moderate Complexity: 1 Mod PT Treatments $Therapeutic Activity: 8-22 mins       Johnn Hai, SPT  Johnn Hai 07/01/2021, 3:38 PM

## 2021-07-01 NOTE — Progress Notes (Signed)
Subjective:  Patient appears to be feeling worse than when she came to the ED. She stated she was as worst as she could possibly feel. She stated now she is having cold like symptoms. She endorsed cough, nausea, chills, muscle aches. She denied SOB, or chest pain. NAEON. She had feeling of nausea in the morning that improved with Zofran.   Objective:  Vital signs in last 24 hours: Vitals:   07/01/21 0329 07/01/21 0717 07/01/21 0800 07/01/21 1239  BP: 122/77 131/80 118/73 113/67  Pulse: 82 93  93  Resp: 16 20 20 20   Temp: 97.9 F (36.6 C) 99.5 F (37.5 C)  (!) 100.9 F (38.3 C)  TempSrc: Oral Oral  Oral  SpO2: 96% 96%  98%  Weight:      Height:       Physical Exam Constitutional:      Appearance: Normal appearance.  HENT:     Head: Normocephalic and atraumatic.     Right Ear: External ear normal.     Left Ear: External ear normal.     Mouth/Throat:     Mouth: Mucous membranes are dry.     Pharynx: Oropharynx is clear. No posterior oropharyngeal erythema.  Eyes:     Extraocular Movements: Extraocular movements intact.     Conjunctiva/sclera: Conjunctivae normal.     Pupils: Pupils are equal, round, and reactive to light.  Cardiovascular:     Rate and Rhythm: Normal rate and regular rhythm.     Pulses: Normal pulses.     Heart sounds: Normal heart sounds.  Pulmonary:     Effort: Pulmonary effort is normal.     Breath sounds: Normal breath sounds.  Abdominal:     General: Bowel sounds are normal.     Palpations: Abdomen is soft.  Musculoskeletal:        General: Normal range of motion.     Cervical back: Normal range of motion and neck supple.     Right lower leg: No edema.     Left lower leg: No edema.  Skin:    General: Skin is warm and dry.     Capillary Refill: Capillary refill takes less than 2 seconds.     Coloration: Skin is not jaundiced.  Neurological:     General: No focal deficit present.     Mental Status: She is alert and oriented to person, place,  and time.     Cranial Nerves: No cranial nerve deficit.     Motor: No weakness (ULE=5/5, URE=5/5, LRE=5/5, LLE=5/5).  Psychiatric:        Mood and Affect: Mood normal.        Behavior: Behavior normal.     Assessment/Plan:  Active Problems:   Severe hypertension  Severe Hypertension Initial presentation was concerning for CVA however MRI showed no evidence of acute intracranial abnormality. Patient reports improvement in symptoms since presentation to ED and with gradual BP control. Patient states that she is adherent to home medications for BP including carvedilol, isosorbide mononitrate, lisinopril, HCTZ, and amlodipine. She states that when she does check her BP at home that it runs in the 150s+. MRI and CT scan rule out acute stroke on admission. Patient had history of PRES syndrome in 2016.  Plan: -Continue current medications (Coreg 25 mg BID, Norvasc 5 mg) -Daily BMP -Continue to keep blood pressure sys<180. Current normotensive 113/67.  COVID 19 infection Patient had incidental COVID infection during this ED visit. She did not endorse any symptoms initially  but has symptoms that are consistent with a COVID infection including congestion, nausea, and vomiting, with recent development of cough and diffuse muscle aches.   Plan: -Continue to monitor patient for symptom development -Symptom management (Tylenol 650 mg q4hr prn for pain and fever), (Mucinex prn for congestion, cough).   History of diabetes mellitus/peripheral neuropathy Patient has history of diabetes mellitus but denies home management of diabetes and is unsure of last HbA1c though states that she remembers it being high. She has symptoms of peripheral neuropathy.  Plan: - Check HbA1c  -A1c is 7.0 - Begin glipizide 10 mg daily - Begin novolog SS insulin three times daily with meals - Begin linagliptin 5 mg by mouth daily -Gabapentin 300 mg TID  History of DVT Patient has a history of DVT and is currently  on  Lovenox 40 mg daily prophylaxis.  Plan: -Continue Lovenox 40 mg daily  History of Stroke Patient has history of PRES syndrome in 2016. She states this appears similar to that feeling. MRI and CT for stroke have been negative so far.   Plan: -Continue Rosuvastatin 40 mg daily  Urinary urgency Patient complained of urinary urgency that has been recent where she is soiling her pants instead of making it to the bathroom. She stated it started 06/28/21.   Plan: -Continue to monitor -Urinalysis -Post void residual volume -Follow up outpatient  Prior to Admission Living Arrangement: Anticipated Discharge Location: Barriers to Discharge: Dispo: Anticipated discharge in approximately 1 day(s).   Gwenevere Abbot, MD 07/01/2021, 2:12 PM

## 2021-07-02 ENCOUNTER — Inpatient Hospital Stay (HOSPITAL_COMMUNITY): Payer: Medicare Other

## 2021-07-02 DIAGNOSIS — F411 Generalized anxiety disorder: Secondary | ICD-10-CM

## 2021-07-02 LAB — BASIC METABOLIC PANEL
Anion gap: 7 (ref 5–15)
BUN: 16 mg/dL (ref 6–20)
CO2: 23 mmol/L (ref 22–32)
Calcium: 8.1 mg/dL — ABNORMAL LOW (ref 8.9–10.3)
Chloride: 107 mmol/L (ref 98–111)
Creatinine, Ser: 1.12 mg/dL — ABNORMAL HIGH (ref 0.44–1.00)
GFR, Estimated: 57 mL/min — ABNORMAL LOW (ref >60)
Glucose, Bld: 52 mg/dL — ABNORMAL LOW (ref 70–99)
Potassium: 3.4 mmol/L — ABNORMAL LOW (ref 3.5–5.1)
Sodium: 137 mmol/L (ref 135–145)

## 2021-07-02 LAB — GLUCOSE, CAPILLARY
Glucose-Capillary: 110 mg/dL — ABNORMAL HIGH (ref 70–99)
Glucose-Capillary: 105 mg/dL — ABNORMAL HIGH (ref 70–99)
Glucose-Capillary: 55 mg/dL — ABNORMAL LOW (ref 70–99)
Glucose-Capillary: 80 mg/dL (ref 70–99)
Glucose-Capillary: 44 mg/dL — CL (ref 70–99)
Glucose-Capillary: 74 mg/dL (ref 70–99)
Glucose-Capillary: 206 mg/dL — ABNORMAL HIGH (ref 70–99)
Glucose-Capillary: 182 mg/dL — ABNORMAL HIGH (ref 70–99)

## 2021-07-02 MED ORDER — DEXTROSE 50 % IV SOLN
1.0000 | Freq: Once | INTRAVENOUS | Status: AC
  Administered 2021-07-02: 50 mL via INTRAVENOUS

## 2021-07-02 MED ORDER — DEXTROSE 50 % IV SOLN
INTRAVENOUS | Status: AC
  Filled 2021-07-02: qty 50

## 2021-07-02 MED ORDER — ACETAMINOPHEN 325 MG PO TABS
650.0000 mg | ORAL_TABLET | Freq: Four times a day (QID) | ORAL | Status: DC
  Administered 2021-07-02 – 2021-07-09 (×26): 650 mg via ORAL
  Filled 2021-07-02 (×27): qty 2

## 2021-07-02 MED ORDER — METOPROLOL TARTRATE 12.5 MG HALF TABLET
12.5000 mg | ORAL_TABLET | Freq: Once | ORAL | Status: AC
  Administered 2021-07-02: 12.5 mg via ORAL
  Filled 2021-07-02: qty 1

## 2021-07-02 MED ORDER — ROSUVASTATIN CALCIUM 20 MG PO TABS: 40.0000 mg | ORAL_TABLET | Freq: Every day | ORAL | Status: DC

## 2021-07-02 MED ORDER — DM-GUAIFENESIN ER 30-600 MG PO TB12
1.0000 | ORAL_TABLET | Freq: Two times a day (BID) | ORAL | Status: DC
  Administered 2021-07-02 – 2021-07-09 (×15): 1 via ORAL
  Filled 2021-07-02 (×15): qty 1

## 2021-07-02 MED ORDER — CARVEDILOL 25 MG PO TABS
25.0000 mg | ORAL_TABLET | Freq: Two times a day (BID) | ORAL | Status: DC
  Administered 2021-07-02 – 2021-07-09 (×15): 25 mg via ORAL
  Filled 2021-07-02 (×15): qty 1

## 2021-07-02 NOTE — Discharge Summary (Addendum)
Name: Mary Cuevas MRN: [id number] DOB: December 24, 1960 60 y.o. PCP: Rory Percy, MD  Date of Admission: 06/29/2021  8:08 PM Date of Discharge: 07/09/2021 Attending Physician: Inez Catalina, MD  Discharge Diagnosis: 1. Hypertensive Emergency 2. COVID Pneumonia 3. DM Type 2 5. Peripheral Neuropathy 4. Urinary urgency 5. Anxiety  Discharge Medications: Allergies as of 07/09/2021       Reactions   Demerol [meperidine] Nausea And Vomiting   Vicodin [hydrocodone-acetaminophen] Nausea And Vomiting   Dilaudid [hydromorphone Hcl] Nausea And Vomiting, Other (See Comments)   " made me feel like I was crazy" Requests not to take this medication again. " made me feel like I was crazy" Requests not to take this medication again.        Medication List     TAKE these medications    acetaminophen 325 MG tablet Commonly known as: TYLENOL Take 2 tablets (650 mg total) by mouth every 6 (six) hours as needed for moderate pain, mild pain, fever or headache.   amLODipine 10 MG tablet Commonly known as: NORVASC Take 10 mg by mouth daily.   aspirin EC 81 MG tablet Take 81 mg by mouth daily.   benzonatate 200 MG capsule Commonly known as: TESSALON Take 1 capsule (200 mg total) by mouth 3 (three) times daily for 10 days.   Biotin 1000 MCG tablet Take 1,000 mcg by mouth daily.   carvedilol 25 MG tablet Commonly known as: COREG Take 1 tablet (25 mg total) by mouth 2 (two) times daily with a meal. What changed: Another medication with the same name was removed. Continue taking this medication, and follow the directions you see here.   chlorpheniramine-HYDROcodone 10-8 MG/5ML Suer Commonly known as: TUSSIONEX Take 5 mLs by mouth every 12 (twelve) hours for 10 days.   dextromethorphan 30 MG/5ML liquid Commonly known as: DELSYM Take 10 mLs (60 mg total) by mouth 2 (two) times daily for 10 days.   gabapentin 300 MG capsule Commonly known as: NEURONTIN Take 300 mg by mouth 3  (three) times daily.   glipiZIDE 10 MG tablet Commonly known as: GLUCOTROL Take 10 mg by mouth 2 (two) times daily. What changed: Another medication with the same name was removed. Continue taking this medication, and follow the directions you see here.   hydrochlorothiazide 12.5 MG capsule Commonly known as: MICROZIDE Take 12.5 mg by mouth daily.   ibuprofen 400 MG tablet Commonly known as: ADVIL Take 1 tablet (400 mg total) by mouth every 6 (six) hours as needed for mild pain (or Fever >/= 101).   lidocaine 5 % Commonly known as: LIDODERM Place 1 patch onto the skin daily. Remove & Discard patch within 12 hours or as directed by MD   nitroGLYCERIN 0.4 MG SL tablet Commonly known as: NITROSTAT Place 0.4 mg under the tongue every 5 (five) minutes as needed for chest pain.   omeprazole 20 MG capsule Commonly known as: PRILOSEC Take 1 capsule (20 mg total) by mouth 2 (two) times daily before a meal. What changed: when to take this   ondansetron 4 MG tablet Commonly known as: ZOFRAN Take 1 tablet (4 mg total) by mouth every 8 (eight) hours as needed for up to 10 days for nausea or vomiting.   polyethylene glycol 17 g packet Commonly known as: MIRALAX / GLYCOLAX Take 17 g by mouth daily. Start taking on: July 10, 2021   predniSONE 20 MG tablet Commonly known as: DELTASONE Take 2 tablets (40 mg total)  by mouth daily with breakfast for 5 doses. Start taking on: July 10, 2021   rosuvastatin 40 MG tablet Commonly known as: CRESTOR Take 40 mg by mouth at bedtime.   Tradjenta 5 MG Tabs tablet Generic drug: linagliptin Take 5 mg by mouth daily.   Vitamin D (Ergocalciferol) 1.25 MG (50000 UNIT) Caps capsule Commonly known as: DRISDOL Take 50,000 Units by mouth every 7 (seven) days.               Durable Medical Equipment  (From admission, onward)           Start     Ordered   07/09/21 1243  For home use only DME oxygen  Once       Question Answer Comment   Length of Need 6 Months   Mode or (Route) Nasal cannula   Frequency Continuous (stationary and portable oxygen unit needed)   Oxygen delivery system Gas      07/09/21 1242   07/03/21 1533  For home use only DME 3 n 1  Once        07/03/21 1532   07/03/21 1533  For home use only DME Walker rolling  Once       Question Answer Comment  Walker: With 5 Inch Wheels   Patient needs a walker to treat with the following condition Pneumonia      07/03/21 1532            Disposition and follow-up:   [name] was discharged from [facility] in Stable condition.  At the hospital follow up visit please address:  1. COVID resolution: Patient had Covid infection that was an incidental finding in the ED after coming in for hypertensive emergency but later progressed to Covid pneumonia. She received Plaxovid and decadron for this and has shown significant improvement. Follow up to see if she has full resolution.  2. Hypertension: She presented with hypertensive emergency and weakness. No stroke was seen on imaging. BP was controlled with BP. During her stay she had labile blood pressures with fluctuations. She was anxious from getting COVID. 3. Anxiety: Patient demonstrated great anxiety during hospital stay. Advised some short term anxiolytics but patient hesitant. Did take hydroxyzine a couple of times. She may benefit from further evaluation during follow up.  3. Urinary incontinence: Patient present with some urinary incontinence stating she had urgency but UA was negative. No more symptoms endorsed. Will follow up for resolution.  5. Diabetes Mellitus II: She was put on insulin during inpatient. Will be discharged with home meds so will advise adjustments as needed.   2.  Labs / imaging needed at time of follow-up: CBC, BMP  3.  Pending labs/ test needing follow-up: None  Follow-up Appointments: PCP Appointment [facility] Medicine [facility] at [facility] with Dr. Karilyn Cota  on 07/09/21 at 10:15am.  Follow-up Information     [facility] Follow up.   Specialty: Home Health Services Why: for home health services, they will call you in 1-2 days to set up a time to go out to your house to see you Contact information: [address] [place] [address] [phone]                 Hospital Course by date: 06/30/21-Patient presented to ED with elevated blood pressure at 204/120 with symptoms consistent with stroke. She had a history of PRES in 2014. She had some muscle pains and right sided weakness. PE and  imaging negative for stroke. BP target set at <180 sys with slow decline.  07/01/21-Patient became normotensive but progressively felt worse. Her strength was 5/5 in all extremities. She started developed full COVID symptoms today with chlls, cough, muscle aches, and fatigue.  07/02/21-07/08/21: Patient started on Paxlovid. CXR showed pneumonia so Decadron was added but discontinued two days later due to improvement in clinical picture. Patient continued to have symptoms so decadron was restarted. Throughout this period she was symptomatically managed with tylenol for pain, Mucinex DM and Tessalon for cough with later addition of Tussionex. She tolerated that well. Due to her blood pressure lability and tachycardia along with shortness of breath, a CTA was ordered which was negative for PE. Patient improved significantly with restart of decadron. Her glucose and white count were elevated due to decadron. She ambulated without oxygen and got some shortness of breath.  07/09/21-Patient seen this morning. She appeared to be doing well. Some coughing endorsed but appeared to be minimal. Physical exam showed improvement and she was ambulated with oxygen. Patient deemed stable for discharge. Instructions explained and and patient endorsed understanding. Patient was discharged on 2L of oxygen and instructed to wean as tolerated.   Discharge  Subjective: Patient feeling better than previously. Endorsed some coughing but coughed once during examination. She endorsed doing better but stated she needed to let her family know before discharge so they can be ready. Asked if any acute concerns and stated none other than coughing.  Discharge Exam:   BP 129/73 (BP Location: Right Arm)   Pulse 70   Temp 98.7 F (37.1 C) (Oral)   Resp 20   Ht 5\' 3"  (1.6 m)   Wt 65 kg   SpO2 92%   BMI 25.38 kg/m  Discharge exam:  Physical Exam Constitutional:      General: She is not in acute distress.    Appearance: Normal appearance. She is not ill-appearing.  HENT:     Head: Normocephalic and atraumatic.     Mouth/Throat:     Mouth: Mucous membranes are moist.     Pharynx: Oropharynx is clear. No posterior oropharyngeal erythema.  Eyes:     Extraocular Movements: Extraocular movements intact.     Conjunctiva/sclera: Conjunctivae normal.  Cardiovascular:     Rate and Rhythm: Normal rate and regular rhythm.  Pulmonary:     Effort: Pulmonary effort is normal.     Breath sounds: Normal breath sounds.  Abdominal:     General: Bowel sounds are normal.     Palpations: Abdomen is soft.  Musculoskeletal:        General: Tenderness (chest wall due to coughing) present. No swelling. Normal range of motion.     Cervical back: Normal range of motion and neck supple.  Skin:    General: Skin is warm and dry.     Capillary Refill: Capillary refill takes less than 2 seconds.     Findings: No erythema.  Neurological:     General: No focal deficit present.     Mental Status: She is alert and oriented to person, place, and time.  Psychiatric:        Mood and Affect: Mood normal.        Behavior: Behavior normal.    Pertinent Labs, Studies, and Procedures:  CMP Latest Ref Rng & Units 07/09/2021 07/08/2021 07/07/2021  Glucose 70 - 99 mg/dL 09/06/2021) 048(G) 891(Q)  BUN 6 - 20 mg/dL 945(W) 38(U) 14  Creatinine 0.44 - 1.00 mg/dL 82(C 0.03 4.91  Sodium 135 -  145 mmol/L 135 135 135  Potassium 3.5 - 5.1 mmol/L 4.6 4.1 3.8  Chloride 98 - 111 mmol/L 99 101 102  CO2 22 - 32 mmol/L 27 24 24   Calcium 8.9 - 10.3 mg/dL 4.0(J8.6(L) 8.1(X8.6(L) 9.1(Y8.6(L)  Total Protein 6.5 - 8.1 g/dL - 6.5 -  Total Bilirubin 0.3 - 1.2 mg/dL - 0.6 -  Alkaline Phos 38 - 126 U/L - 105 -  AST 15 - 41 U/L - 17 -  ALT 0 - 44 U/L - 24 -    CBC Latest Ref Rng & Units 07/09/2021 07/08/2021 07/07/2021  WBC 4.0 - 10.5 K/uL 28.4(H) 23.7(H) 16.3(H)  Hemoglobin 12.0 - 15.0 g/dL 10.7(L) 10.6(L) 9.5(L)  Hematocrit 36.0 - 46.0 % 31.6(L) 31.7(L) 28.4(L)  Platelets 150 - 400 K/uL 357 324 305   Leukocytosis appears to be secondary to decadron course. Improving clinical picture.  COVID 19 (06/29/21)- Positive  CT head without contrast: No acute abnormality MR Brain: Normal brain MRI for age. No acute intracranial abnormality identified. CXR: Consolidation concerning for pneumonia. CTA of Chest: No PE, small right effusion. Left lower and right upper pneumonia.   Discharge Instructions: Discharge Instructions     Call MD for:  difficulty breathing, headache or visual disturbances   Complete by: As directed    Call MD for:  extreme fatigue   Complete by: As directed    Call MD for:  hives   Complete by: As directed    Call MD for:  persistant dizziness or light-headedness   Complete by: As directed    Call MD for:  persistant nausea and vomiting   Complete by: As directed    Call MD for:  redness, tenderness, or signs of infection (pain, swelling, redness, odor or green/yellow discharge around incision site)   Complete by: As directed    Call MD for:  severe uncontrolled pain   Complete by: As directed    Call MD for:  temperature >100.4   Complete by: As directed    Diet - low sodium heart healthy   Complete by: As directed    Discharge instructions   Complete by: As directed    You came to the ED with weakness and severe high blood pressure. You were found to have incidental COVID without  any symptoms. Your weakness improved initially as your blood pressure was managed but then got worse and you started to develop COVID symptoms. You were started on an anti-viral medication called Paxlovid. You developed COVID pneumonia and was started on Decadron as well. You improved significantly after this. You had a bad cough and were given a few different medicines for it. At this, you appear to be doing well and would benefit from going home to a familiar environment where recovery can take place. I would advice follow up with your PCP to see if you cleared COVID 19 infection completely. We will discharge you with medicine to manage your symptoms at home but at this time your symptoms are milder then before. Your last day of isolation is on August 10th, 2022. We recommend wearing a mask around others after this date.   Increase activity slowly   Complete by: As directed        Signed: Gwenevere AbbotKhan, Ashani Pumphrey, MD 07/09/2021, 12:59 PM   Pager: [phone]

## 2021-07-02 NOTE — TOC Progression Note (Signed)
Transition of Care [facility]) - Progression Note    Patient Details  Name: Mary Cuevas MRN: [id number] Date of Birth: Jan 23, 1961  Transition of Care [facility]) CM/SW Contact  Lawerance Sabal, RN Phone Number: 07/02/2021, 3:35 PM  Clinical Narrative:    [facility] referral accepted by [facility]    Expected Discharge Plan: Home w Home Health Services Barriers to Discharge: Continued Medical Work up  Expected Discharge Plan and Services Expected Discharge Plan: Home w Home Health Services In-house Referral: Clinical Social Work Discharge Planning Services: CM Consult Post Acute Care Choice: Home Health Living arrangements for the past 2 months: Single Family Home                           HH Arranged: PT [facility] Agency: [facility] Date [facility] Agency Contacted: 07/02/21 Time HH Agency Contacted: 1534 Representative spoke with at [facility] Agency: Kandee Keen   Social Determinants of Health (SDOH) Interventions    Readmission Risk Interventions No flowsheet data found.

## 2021-07-02 NOTE — Progress Notes (Signed)
Mary Cuevas is 60 year old female presenting with hypertensive emergency and weakness. She has PMHx of htn, diabetes, PRES, and DVT. She was found to have COVID 19 on admission and is currently symptomatic from that. Pt on Paxlovid.    Subjective:  Patient continued feeling worst. She said she was feeling a little better from yesterday but still worse then when she came to the ED. She continued having cold like symptoms. She endorsed cough with brown sputum production, nausea, chills, muscle aches. She specifically endorsed right back pain upon coughing and she stated she has been coughing a lot since yesterday. She denied SOB, or chest pain. ROS otherwise negative.   Overnight: Patient had episode of hypotension last night. To prevent that in the future her blood pressure medications were adjusted.   Objective:  Vital signs in last 24 hours: Vitals:   07/02/21 0037 07/02/21 0429 07/02/21 0725 07/02/21 1242  BP: (!) 94/56 124/69 127/78 (!) 185/91  Pulse: 88 89 91 100  Resp: 19 20 18  (!) 22  Temp: 98.1 F (36.7 C) 98.3 F (36.8 C) 99.2 F (37.3 C) 98.5 F (36.9 C)  TempSrc: Axillary Axillary Oral Oral  SpO2: 95% 94% 92% 90%  Weight:      Height:       Physical Exam Constitutional:      Appearance: She is ill-appearing.  HENT:     Head: Normocephalic and atraumatic.     Right Ear: External ear normal.     Left Ear: External ear normal.     Mouth/Throat:     Mouth: Mucous membranes are moist.     Pharynx: Oropharynx is clear. No posterior oropharyngeal erythema.  Eyes:     General: No scleral icterus. Cardiovascular:     Rate and Rhythm: Normal rate and regular rhythm.     Pulses: Normal pulses.     Heart sounds: Normal heart sounds.  Pulmonary:     Effort: Pulmonary effort is normal.     Breath sounds: Normal breath sounds.  Abdominal:     General: Bowel sounds are normal.     Palpations: Abdomen is soft.  Musculoskeletal:        General: Normal range of motion.      Cervical back: Normal range of motion and neck supple.     Right lower leg: No edema.     Left lower leg: No edema.  Skin:    General: Skin is warm and dry.     Capillary Refill: Capillary refill takes less than 2 seconds.     Coloration: Skin is not jaundiced.  Neurological:     General: No focal deficit present.     Mental Status: She is alert and oriented to person, place, and time.     Cranial Nerves: No cranial nerve deficit.     Motor: No weakness (ULE=4/5, URE=4/5, LRE=4/5, LLE=4/5).  Psychiatric:        Mood and Affect: Mood normal.        Behavior: Behavior normal.     Assessment/Plan:  Active Problems:   Severe hypertension   Hypertensive urgency  Severe Hypertension Initial presentation was concerning for CVA however MRI showed no evidence of acute intracranial abnormality. Patient reports improvement in symptoms since presentation to ED and with gradual BP control. Patient states that she is adherent to home medications for BP including carvedilol, isosorbide mononitrate, lisinopril, HCTZ, and amlodipine. She states that when she does check her BP at home that it runs in the  150s+. MRI and CT scan rule out acute stroke on admission. Patient had history of PRES syndrome in 2016. Blood pressures are labile for this patient as she quickly became hypertensive after with-holding Coreg for 1 dose. She became normotensive after her hypertensive emergency as well. Will continue to monitor.  Plan: -Change BP medications (Coreg 25 mg BID, Norvasc 5 mg) to only Coreg 25 mg BID. -Daily BMP -Continue to keep blood pressure sys<180. -Currently hypertensive 185/91.  -Lopressor 12.5 one dose for bp   COVID 19 infection Patient had incidental COVID infection during this ED visit. She did not endorse any symptoms initially but has symptoms that are consistent with a COVID infection including congestion, nausea, and vomiting, with recent development of cough and diffuse muscle aches. She  started having sputum production that is dark brown in color. Also developed righted sided back pain.  Medication scheduled for pain and cough as she has not received them in prn status.   Plan: -Continue to monitor patient for symptom development -Symptom management (Tylenol 650 mg scheduled q6hrs for pain and fever), -Mucinex scheduled for congestion, cough -CXR for cough with sputum production -Patient on Plaxovid   History of diabetes mellitus/peripheral neuropathy Patient has history of diabetes mellitus but denies home management of diabetes and is unsure of last HbA1c though states that she remembers it being high. She has symptoms of peripheral neuropathy. She was started on glipizide 10 mg, Linagliptin 5 mg, and novolog SS insulin. She had two episodes of hypoglycemia so adjustments were made to her regimen.  Plan: - Check HbA1c  -A1c is 7.0 -Continue novolog SS insulin three times daily with meals -Gabapentin 300 mg TID  History of DVT Patient has a history of DVT and is currently  on Lovenox 40 mg daily prophylaxis.  Plan: -Continue Lovenox 40 mg daily  History of Stroke Patient has history of PRES syndrome in 2016. She states this appears similar to that feeling. MRI and CT for stroke have been negative so far. Currently muscle weakness has resolved but patient has fatigue due to COVID symptoms.   Plan: -Rosuvastatin 40 mg daily held as patient is on Plaxovid  Urinary urgency Patient complained of urinary urgency that has been recent where she is soiling her pants instead of making it to the bathroom. She stated it started 06/28/21. She is on a catheter so it is hard to assess.   Plan: -Continue to monitor -Urinalysis  -negative for urinalysis -Post void residual volume  -0 ml post void volume -Follow up outpatient  Prior to Admission Living Arrangement: Anticipated Discharge Location: Barriers to Discharge: Dispo: Anticipated discharge in approximately 2  day(s).   Gwenevere Abbot, MD 07/02/2021, 3:34 PM

## 2021-07-02 NOTE — TOC Initial Note (Signed)
Transition of Care [facility]) - Initial/Assessment Note    Patient Details  Name: Mary Cuevas MRN: [id number] Date of Birth: February 06, 1961  Transition of Care [facility]) CM/SW Contact:    Mearl Latin, LCSW Phone Number: 07/02/2021, 9:36 AM  Clinical Narrative:                 CSW received consult for possible SNF at time of discharge. CSW spoke with patient. She reported that she is hopeful to feel better soon and laments having COVID since she is very careful and clean. She stated that she does not want to go to SNF for rehab and prefers to return home instead as she stated she feels like she would do better in familiar surroundings. She reported that she would agree to home health services. She reports no preference for an agency as long as they are in network with her insurance. CSW discussed equipment needs with patient and she declined 3in1, stating the bathroom was right next to the bedroom. She has a walker and cane at home already. CSW confirmed PCP and address with patient as she is currently staying with her friend at: [address] [place], [place] [address]. No further questions reported at this time.    Expected Discharge Plan: Home w Home Health Services Barriers to Discharge: Continued Medical Work up   Patient Goals and CMS Choice Patient states their goals for this hospitalization and ongoing recovery are:: Feel better CMS Medicare.gov Compare Post Acute Care list provided to:: Patient Choice offered to / list presented to : Patient  Expected Discharge Plan and Services Expected Discharge Plan: Home w Home Health Services In-house Referral: Clinical Social Work Discharge Planning Services: CM Consult Post Acute Care Choice: Home Health Living arrangements for the past 2 months: Single Family Home                                      Prior Living Arrangements/Services Living arrangements for the past 2 months: Single Family Home Lives with:: Friends Patient  language and need for interpreter reviewed:: Yes Do you feel safe going back to the place where you live?: Yes      Need for Family Participation in Patient Care: No (Comment) Care giver support system in place?: Yes (comment) Current home services: DME (walker, cane) Criminal Activity/Legal Involvement Pertinent to Current Situation/Hospitalization: No - Comment as needed  Activities of Daily Living Home Assistive Devices/Equipment: Eyeglasses, CPAP, Dentures (specify type) (partials on top) ADL Screening (condition at time of admission) Patient's cognitive ability adequate to safely complete daily activities?: Yes Is the patient deaf or have difficulty hearing?: Yes (occasionally) Does the patient have difficulty seeing, even when wearing glasses/contacts?: No (patient does wear glasses) Does the patient have difficulty concentrating, remembering, or making decisions?: No Patient able to express need for assistance with ADLs?: Yes Does the patient have difficulty dressing or bathing?: No Independently performs ADLs?: Yes (appropriate for developmental age) Does the patient have difficulty walking or climbing stairs?: No Weakness of Legs: Both Weakness of Arms/Hands: Both  Permission Sought/Granted Permission sought to share information with : Facility Industrial/product designer granted to share information with : Yes, Verbal Permission Granted     Permission granted to share info w AGENCY: [facility]        Emotional Assessment   Attitude/Demeanor/Rapport: Engaged Affect (typically observed): Accepting, Appropriate Orientation: : Oriented to Self, Oriented to Place,  Oriented to  Time, Oriented to Situation Alcohol / Substance Use: Not Applicable Psych Involvement: No (comment)  Admission diagnosis:  Severe hypertension [I10] Hypertensive urgency [I16.0] Patient Active Problem List   Diagnosis Date Noted   Hypertensive urgency 07/01/2021   Severe hypertension 06/30/2021    Unstable angina (HCC) 10/25/2016   Chest pain 10/25/2016   Essential hypertension    Other hyperlipidemia    PCP:  Rory Percy, MD Pharmacy:   [facility] - [place], [place] - [address] [place] [address] Phone: [phone] Fax: [phone]     Social Determinants of Health (SDOH) Interventions    Readmission Risk Interventions No flowsheet data found.

## 2021-07-03 LAB — CBC WITH DIFFERENTIAL/PLATELET
Abs Immature Granulocytes: 0 10*3/uL (ref 0.00–0.07)
Basophils Absolute: 0 10*3/uL (ref 0.0–0.1)
Basophils Relative: 0 %
Eosinophils Absolute: 0 10*3/uL (ref 0.0–0.5)
Eosinophils Relative: 0 %
HCT: 30.3 % — ABNORMAL LOW (ref 36.0–46.0)
Hemoglobin: 10.2 g/dL — ABNORMAL LOW (ref 12.0–15.0)
Lymphocytes Relative: 6 %
Lymphs Abs: 0.5 10*3/uL — ABNORMAL LOW (ref 0.7–4.0)
MCH: 27.2 pg (ref 26.0–34.0)
MCHC: 33.7 g/dL (ref 30.0–36.0)
MCV: 80.8 fL (ref 80.0–100.0)
Monocytes Absolute: 0.2 10*3/uL (ref 0.1–1.0)
Monocytes Relative: 2 %
Neutro Abs: 7.8 10*3/uL — ABNORMAL HIGH (ref 1.7–7.7)
Neutrophils Relative %: 92 %
Platelets: 195 10*3/uL (ref 150–400)
RBC: 3.75 MIL/uL — ABNORMAL LOW (ref 3.87–5.11)
RDW: 14 % (ref 11.5–15.5)
WBC: 8.5 10*3/uL (ref 4.0–10.5)
nRBC: 0 % (ref 0.0–0.2)
nRBC: 1 /100 WBC — ABNORMAL HIGH

## 2021-07-03 LAB — GLUCOSE, CAPILLARY
Glucose-Capillary: 138 mg/dL — ABNORMAL HIGH (ref 70–99)
Glucose-Capillary: 57 mg/dL — ABNORMAL LOW (ref 70–99)
Glucose-Capillary: 90 mg/dL (ref 70–99)
Glucose-Capillary: 80 mg/dL (ref 70–99)
Glucose-Capillary: 172 mg/dL — ABNORMAL HIGH (ref 70–99)
Glucose-Capillary: 253 mg/dL — ABNORMAL HIGH (ref 70–99)
Glucose-Capillary: 219 mg/dL — ABNORMAL HIGH (ref 70–99)
Glucose-Capillary: 249 mg/dL — ABNORMAL HIGH (ref 70–99)

## 2021-07-03 LAB — BASIC METABOLIC PANEL
Anion gap: 7 (ref 5–15)
BUN: 6 mg/dL (ref 6–20)
CO2: 20 mmol/L — ABNORMAL LOW (ref 22–32)
Calcium: 8.2 mg/dL — ABNORMAL LOW (ref 8.9–10.3)
Chloride: 110 mmol/L (ref 98–111)
Creatinine, Ser: 0.71 mg/dL (ref 0.44–1.00)
GFR, Estimated: >60 mL/min (ref >60)
Glucose, Bld: 72 mg/dL (ref 70–99)
Potassium: 3.3 mmol/L — ABNORMAL LOW (ref 3.5–5.1)
Sodium: 137 mmol/L (ref 135–145)

## 2021-07-03 MED ORDER — POTASSIUM CHLORIDE CRYS ER 20 MEQ PO TBCR: 40.0000 meq | EXTENDED_RELEASE_TABLET | Freq: Two times a day (BID) | ORAL | Status: DC

## 2021-07-03 MED ORDER — DEXTROSE 50 % IV SOLN: 12.5000 g | INTRAVENOUS | Status: AC

## 2021-07-03 MED ORDER — DEXTROSE 50 % IV SOLN
INTRAVENOUS | Status: AC
  Administered 2021-07-03: 12.5 g via INTRAVENOUS
  Filled 2021-07-03: qty 50

## 2021-07-03 MED ORDER — POTASSIUM CHLORIDE CRYS ER 20 MEQ PO TBCR
30.0000 meq | EXTENDED_RELEASE_TABLET | Freq: Two times a day (BID) | ORAL | Status: DC
  Administered 2021-07-03: 30 meq via ORAL
  Filled 2021-07-03: qty 1

## 2021-07-03 MED ORDER — BENZONATATE 100 MG PO CAPS
200.0000 mg | ORAL_CAPSULE | Freq: Three times a day (TID) | ORAL | Status: DC
  Administered 2021-07-03 – 2021-07-09 (×18): 200 mg via ORAL
  Filled 2021-07-03 (×19): qty 2

## 2021-07-03 MED ORDER — DEXAMETHASONE SODIUM PHOSPHATE 10 MG/ML IJ SOLN
6.0000 mg | Freq: Once | INTRAMUSCULAR | Status: AC
  Administered 2021-07-03: 6 mg via INTRAVENOUS
  Filled 2021-07-03: qty 1

## 2021-07-03 MED ORDER — ALUM & MAG HYDROXIDE-SIMETH 200-200-20 MG/5ML PO SUSP
30.0000 mL | ORAL | Status: DC | PRN
  Filled 2021-07-03: qty 30

## 2021-07-03 NOTE — Progress Notes (Signed)
K-pad applied at 1715 for back pain

## 2021-07-03 NOTE — Plan of Care (Signed)

## 2021-07-03 NOTE — Progress Notes (Signed)
Patient experienced another hypoglyemic event overnight around 0245. Patient sweaty, shaky, clammy, and restless. Temperature and blood temperature assessed - patient normothermic but blood glucose at 57. Per protocol 12.5g D50 administered d/t patient's nausea and blood glucose rechecked at 0310. Follow up blood glucose 138. On call provider notified of episode. Will continue to monitor.

## 2021-07-03 NOTE — Progress Notes (Addendum)
Mary Cuevas is 60 year old female presenting with hypertensive emergency and weakness. Mary Cuevas has PMHx of htn, diabetes, PRES, and DVT. Mary Cuevas was found to have COVID 19 on admission and is currently symptomatic from that. Pt on Paxlovid.    Subjective:  Patient continued feeling worst. Mary Cuevas said Mary Cuevas was feeling a little better from yesterday but still worse then when Mary Cuevas came to the ED. Mary Cuevas continued having cold like symptoms. Mary Cuevas endorsed cough with brown sputum production, nausea, chills, muscle aches. Mary Cuevas still endorsed right back pain upon coughing. Mary Cuevas did not get any medication although a heat pack was ordered. Patient is still coughing with brown sputum. Mary Cuevas stated Mary Cuevas had diarrhea start since yesterday. Mary Cuevas denied SOB, or chest pain. ROS otherwise negative.   Overnight: Patient had episode of hypoglycemia overnight corrected with D50.   Objective:  Vital signs in last 24 hours: Vitals:   07/03/21 0051 07/03/21 0242 07/03/21 0400 07/03/21 0745  BP: (!) 108/92  121/77 (!) 191/103  Pulse: 77  89 100  Resp: 16  (!) 22 20  Temp: 98.9 F (37.2 C) 98.2 F (36.8 C) 98.3 F (36.8 C) 99.3 F (37.4 C)  TempSrc: Axillary Oral Oral Oral  SpO2: 94%  92% (!) 88%  Weight:      Height:       Physical Exam Constitutional:      Appearance: Mary Cuevas is ill-appearing.  HENT:     Head: Normocephalic and atraumatic.     Right Ear: External ear normal.     Left Ear: External ear normal.     Mouth/Throat:     Mouth: Mucous membranes are moist.     Pharynx: Oropharynx is clear. No posterior oropharyngeal erythema.  Eyes:     General: No scleral icterus. Cardiovascular:     Rate and Rhythm: Normal rate and regular rhythm.     Pulses: Normal pulses.     Heart sounds: Normal heart sounds.  Pulmonary:     Effort: Pulmonary effort is normal.     Breath sounds: Rales present.  Abdominal:     General: Bowel sounds are normal.     Palpations: Abdomen is soft.  Musculoskeletal:        General:  Tenderness (right lower back tenderness) present. Normal range of motion.     Cervical back: Normal range of motion and neck supple.     Right lower leg: No edema.     Left lower leg: No edema.  Skin:    General: Skin is warm and dry.     Capillary Refill: Capillary refill takes less than 2 seconds.     Coloration: Skin is not jaundiced.  Neurological:     General: No focal deficit present.     Mental Status: Mary Cuevas is alert and oriented to person, place, and time.     Cranial Nerves: No cranial nerve deficit.     Motor: No weakness (ULE=5/5, URE=5/5, LRE=5/5, LLE=5/5).  Psychiatric:        Mood and Affect: Mood normal.        Behavior: Behavior normal.     Assessment/Plan:  Active Problems:   Severe hypertension   Hypertensive urgency  Severe Hypertension Initial presentation was concerning for CVA however MRI showed no evidence of acute intracranial abnormality. Patient reports improvement in symptoms since presentation to ED and with gradual BP control. Patient states that Mary Cuevas is adherent to home medications for BP including carvedilol, isosorbide mononitrate, lisinopril, HCTZ, and amlodipine. Mary Cuevas states that when  Mary Cuevas does check her BP at home that it runs in the 150s+. MRI and CT scan rule out acute stroke on admission. Patient had history of PRES syndrome in 2016. Blood pressures are labile for this patient as Mary Cuevas quickly became hypertensive after with-holding Coreg for 1 dose. Mary Cuevas became normotensive quickly after her hypertensive emergency as well. Will continue to monitor.   Plan: -Change BP medications (Coreg 25 mg BID, Norvasc 5 mg) to only Coreg 25 mg BID. -Daily BMP -Continue to keep blood pressure sys<180.  -Currently hypertensive 185/91. -Lopressor 12.5 one dose for bp -With labile Bps and tachycardia and SOB, will rule out PE with CTA chest   COVID 19 Pneumonia Patient had incidental COVID infection during this ED visit. Mary Cuevas did not endorse any symptoms initially but  has symptoms that are consistent with a COVID infection including congestion, nausea, and vomiting, with recent development of cough and diffuse muscle aches. Mary Cuevas started having sputum production that is dark brown in color. Also developed righted sided back pain.  Medication scheduled for pain and cough as Mary Cuevas has not received them in prn status.   Plan: -Continue to monitor patient for symptom development -Symptom management (Tylenol 650 mg scheduled q6hrs for pain and fever), -Mucinex DM scheduled for congestion, cough -CXR for cough with sputum production  -right upper lobe infiltrates present suggesting COVID pneumonia -Patient on Plaxovid  -Started Decadron 6 mg daily  History of diabetes mellitus/peripheral neuropathy Patient has history of diabetes mellitus but denies home management of diabetes and is unsure of last HbA1c though states that Mary Cuevas remembers it being high. Mary Cuevas has symptoms of peripheral neuropathy. Mary Cuevas was started on glipizide 10 mg, Linagliptin 5 mg, and novolog SS insulin. Mary Cuevas had two episodes of hypoglycemia so adjustments were made to her regimen.  Plan: - Check HbA1c  -A1c is 7.0 -Continue novolog SS insulin three times daily with meals -Gabapentin 300 mg TID  History of DVT Patient has a history of DVT and is currently on Lovenox 40 mg daily prophylaxis.  Plan: -Continue Lovenox 40 mg daily  History of Stroke Patient has history of PRES syndrome in 2016. Mary Cuevas states this appears similar to that feeling. MRI and CT for stroke have been negative so far. Currently muscle weakness has resolved but patient has fatigue due to COVID symptoms.   Plan: -Rosuvastatin 40 mg daily held as patient is on Plaxovid  Urinary urgency Patient complained of urinary urgency that has been recent where Mary Cuevas is soiling her pants instead of making it to the bathroom. Mary Cuevas stated it started 06/28/21. Mary Cuevas is on a catheter so it is hard to assess.   Plan: -Continue to  monitor -Urinalysis  -negative for urinalysis -Post void residual volume  -0 ml post void volume -Follow up outpatient  Prior to Admission Living Arrangement: Anticipated Discharge Location: Barriers to Discharge: Dispo: Anticipated discharge in approximately 2 day(s).   Gwenevere Abbot, MD 07/03/2021, 10:40 AM

## 2021-07-03 NOTE — Progress Notes (Signed)
VAST consult received to obtain 20g or larger IV access for CT scan. Upon arrival at patient's bedside, she informed VAST RN that female nurse had just placed IV. Inquired if IV had good blood return, to which she answered she did not know. Assessed 20g IV in right [facility] which had excellent blood return and flushed easily without burning, redness, or swelling.  Pt verbalized that the IV in her left hand was placed last night, but burned with NS flush. Assessed left hand IV which had resistance with flushing of NS and patient flinched with complaints of burning. IV discontinued. Pt's IV in her left AC without blood return, but flushed easily with NS and did not burn. However, dressing not intact. Dressing changed per protocol.

## 2021-07-03 NOTE — Progress Notes (Signed)
Physical Therapy Treatment Patient Details Name: Mary Cuevas MRN: [id number] DOB: January 08, 1961 Today's Date: 07/03/2021    History of Present Illness Pt is a 59yo female presenting to [facility] ED on 7/31 secondary to weakness and L facial droop. Likely secondary to severe HTN. Found to be +COVID-19 upon admission. Imaging revealed no acute abnormalities. Pt developed covid PNA.  PMH: HTN, DM, prior stroke, PRES, peripheral neuropathy (pt report from H&P).    PT Comments    Pt continues to have limited mobility. Needs max encouragement to participate. Pt feel weak and explained that she needs to mobilize to regain strength. Pt refuses SNF and wants to return home at DC and states she can have assist at home.    Follow Up Recommendations  Home health PT;Supervision for mobility/OOB     Equipment Recommendations  Rolling walker with 5" wheels;3in1 (PT)    Recommendations for Other Services       Precautions / Restrictions Precautions Precautions: Fall    Mobility  Bed Mobility Overal bed mobility: Needs Assistance Bed Mobility: Supine to Sit;Sit to Supine     Supine to sit: Min guard;HOB elevated Sit to supine: Min guard;HOB elevated   General bed mobility comments: Incr time to perform    Transfers Overall transfer level: Needs assistance Equipment used: Rolling walker (2 wheeled);None Transfers: Sit to/from [employer] to Stand: Min assist;Min guard Stand pivot transfers: Min guard       General transfer comment: Initially min assist to vring her hips up when standing from bed with walker. To stand when on [employer] without device only min guard for safety and then able to stand pivot back to bed with min guard.  Ambulation/Gait Ambulation/Gait assistance: Min assist Gait Distance (Feet): 3 Feet (forward and back) Assistive device: Rolling walker (2 wheeled) Gait Pattern/deviations: Step-through pattern;Decreased step length - right;Decreased step  length - left;Shuffle;Narrow base of support Gait velocity: decr Gait velocity interpretation: <1.31 ft/sec, indicative of household ambulator General Gait Details: Assist for support. took several steps forward and then felt too weak and needed to step back to bed.   Stairs             Wheelchair Mobility    Modified Rankin (Stroke Patients Only)       Balance Overall balance assessment: Needs assistance Sitting-balance support: No upper extremity supported;Feet supported Sitting balance-Leahy Scale: Good     Standing balance support: Single extremity supported Standing balance-Leahy Scale: Poor Standing balance comment: UE support                            Cognition Arousal/Alertness: Awake/alert Behavior During Therapy: WFL for tasks assessed/performed Overall Cognitive Status: Within Functional Limits for tasks assessed                                 General Comments: Pt perseverating on feeling weak and that she had her vaccine      Exercises      General Comments General comments (skin integrity, edema, etc.): VSS on 2L      Pertinent Vitals/Pain Pain Assessment: No/denies pain    Home Living                      Prior Function            PT Goals (current goals can now be found  in the care plan section) Progress towards PT goals: Progressing toward goals    Frequency    Min 3X/week      PT Plan Discharge plan needs to be updated    Co-evaluation              AM-PAC PT "6 Clicks" Mobility   Outcome Measure  Help needed turning from your back to your side while in a flat bed without using bedrails?: A Little Help needed moving from lying on your back to sitting on the side of a flat bed without using bedrails?: A Little Help needed moving to and from a bed to a chair (including a wheelchair)?: A Little Help needed standing up from a chair using your arms (e.g., wheelchair or bedside chair)?: A  Little Help needed to walk in hospital room?: A Lot Help needed climbing 3-5 steps with a railing? : Total 6 Click Score: 15    End of Session Equipment Utilized During Treatment: Oxygen Activity Tolerance: Patient limited by fatigue Patient left: in bed;with call bell/phone within reach;with bed alarm set   PT Visit Diagnosis: Muscle weakness (generalized) (M62.81);Unsteadiness on feet (R26.81)     Time: 9774-1423 PT Time Calculation (min) (ACUTE ONLY): 21 min  Charges:  $Gait Training: 8-22 mins                     [facility] PT Acute Rehabilitation Services Pager [phone] Office [phone]    Angelina Ok [facility] 07/03/2021, 1:51 PM

## 2021-07-03 NOTE — Progress Notes (Signed)
CT called notifying me that they can't take pt to CT until we have an new IV 22g < and w/in 3 days.   Will try to place new IV or will put order for IV team.

## 2021-07-04 ENCOUNTER — Inpatient Hospital Stay (HOSPITAL_COMMUNITY): Payer: Medicare Other

## 2021-07-04 DIAGNOSIS — I10 Essential (primary) hypertension: Secondary | ICD-10-CM | POA: Diagnosis not present

## 2021-07-04 DIAGNOSIS — J1282 Pneumonia due to coronavirus disease 2019: Secondary | ICD-10-CM | POA: Diagnosis present

## 2021-07-04 DIAGNOSIS — U071 COVID-19: Secondary | ICD-10-CM | POA: Diagnosis not present

## 2021-07-04 LAB — CBC WITH DIFFERENTIAL/PLATELET
Abs Immature Granulocytes: 0.09 10*3/uL — ABNORMAL HIGH (ref 0.00–0.07)
Basophils Absolute: 0 10*3/uL (ref 0.0–0.1)
Basophils Relative: 0 %
Eosinophils Absolute: 0 10*3/uL (ref 0.0–0.5)
Eosinophils Relative: 0 %
HCT: 30.3 % — ABNORMAL LOW (ref 36.0–46.0)
Hemoglobin: 9.9 g/dL — ABNORMAL LOW (ref 12.0–15.0)
Immature Granulocytes: 1 %
Lymphocytes Relative: 4 %
Lymphs Abs: 0.4 10*3/uL — ABNORMAL LOW (ref 0.7–4.0)
MCH: 26.7 pg (ref 26.0–34.0)
MCHC: 32.7 g/dL (ref 30.0–36.0)
MCV: 81.7 fL (ref 80.0–100.0)
Monocytes Absolute: 0.3 10*3/uL (ref 0.1–1.0)
Monocytes Relative: 3 %
Neutro Abs: 7.9 10*3/uL — ABNORMAL HIGH (ref 1.7–7.7)
Neutrophils Relative %: 92 %
Platelets: 213 10*3/uL (ref 150–400)
RBC: 3.71 MIL/uL — ABNORMAL LOW (ref 3.87–5.11)
RDW: 14.2 % (ref 11.5–15.5)
WBC: 8.9 10*3/uL (ref 4.0–10.5)
nRBC: 0 % (ref 0.0–0.2)

## 2021-07-04 LAB — GLUCOSE, CAPILLARY
Glucose-Capillary: 202 mg/dL — ABNORMAL HIGH (ref 70–99)
Glucose-Capillary: 203 mg/dL — ABNORMAL HIGH (ref 70–99)
Glucose-Capillary: 236 mg/dL — ABNORMAL HIGH (ref 70–99)
Glucose-Capillary: 324 mg/dL — ABNORMAL HIGH (ref 70–99)

## 2021-07-04 LAB — BASIC METABOLIC PANEL
Anion gap: 8 (ref 5–15)
BUN: 7 mg/dL (ref 6–20)
CO2: 23 mmol/L (ref 22–32)
Calcium: 9 mg/dL (ref 8.9–10.3)
Chloride: 107 mmol/L (ref 98–111)
Creatinine, Ser: 0.88 mg/dL (ref 0.44–1.00)
GFR, Estimated: >60 mL/min (ref >60)
Glucose, Bld: 339 mg/dL — ABNORMAL HIGH (ref 70–99)
Potassium: 3.5 mmol/L (ref 3.5–5.1)
Sodium: 138 mmol/L (ref 135–145)

## 2021-07-04 LAB — FERRITIN: Ferritin: 1538 ng/mL — ABNORMAL HIGH (ref 11–307)

## 2021-07-04 LAB — MAGNESIUM: Magnesium: 2 mg/dL (ref 1.7–2.4)

## 2021-07-04 LAB — D-DIMER, QUANTITATIVE: D-Dimer, Quant: 1.88 ug/mL-FEU — ABNORMAL HIGH (ref 0.00–0.50)

## 2021-07-04 LAB — C-REACTIVE PROTEIN: CRP: 30.7 mg/dL — ABNORMAL HIGH (ref <1.0)

## 2021-07-04 MED ORDER — POTASSIUM CHLORIDE CRYS ER 20 MEQ PO TBCR
40.0000 meq | EXTENDED_RELEASE_TABLET | Freq: Once | ORAL | Status: AC
  Administered 2021-07-04: 40 meq via ORAL
  Filled 2021-07-04: qty 2

## 2021-07-04 MED ORDER — DEXAMETHASONE SODIUM PHOSPHATE 10 MG/ML IJ SOLN
6.0000 mg | Freq: Once | INTRAMUSCULAR | Status: AC
  Administered 2021-07-04: 6 mg via INTRAVENOUS
  Filled 2021-07-04: qty 1

## 2021-07-04 MED ORDER — INSULIN ASPART 100 UNIT/ML IJ SOLN: 2.0000 [IU] | Freq: Three times a day (TID) | INTRAMUSCULAR | Status: DC

## 2021-07-04 MED ORDER — INSULIN GLARGINE-YFGN 100 UNIT/ML ~~LOC~~ SOLN
5.0000 [IU] | Freq: Every day | SUBCUTANEOUS | Status: DC
  Administered 2021-07-04 – 2021-07-05 (×2): 5 [IU] via SUBCUTANEOUS
  Filled 2021-07-04 (×3): qty 0.05

## 2021-07-04 MED ORDER — IOHEXOL 350 MG/ML SOLN
75.0000 mL | Freq: Once | INTRAVENOUS | Status: AC | PRN
  Administered 2021-07-04: 75 mL via INTRAVENOUS

## 2021-07-04 MED ORDER — HYDROXYZINE HCL 25 MG PO TABS
50.0000 mg | ORAL_TABLET | Freq: Three times a day (TID) | ORAL | Status: DC
  Filled 2021-07-04 (×2): qty 2

## 2021-07-04 NOTE — Progress Notes (Signed)
  Date: 07/04/2021  Patient name: Mary Cuevas  Medical record number: [id number]  Date of birth: 1961-08-01        I have seen and evaluated this patient and I have discussed the plan of care with the house staff. Please see Dr. Milta Deiters note for complete details. I concur with his findings and plan.   Inez Catalina, MD 07/04/2021, 9:18 PM

## 2021-07-04 NOTE — Progress Notes (Signed)
Mary Cuevas is 60 year old female presenting with hypertensive emergency and weakness. She has PMHx of htn, diabetes, PRES, and DVT. She was found to have COVID 19 on admission and is currently symptomatic from that. Pt on Paxlovid.    Subjective: Patient evaluated at bedside. She appears comfortable this morning. Patient stated she started coughing earlier but mucus has turned from dark brown to clear yellow. She states the cough medicine helped her a lot. She did endorse a lot of anxiety about COVID and how she got it and her prognosis. Was told about her improvements. She stated the K pack helped her back pain.    Overnight: No acute events overnight.  Objective:  Vital signs in last 24 hours: Vitals:   07/04/21 0000 07/04/21 0435 07/04/21 0830 07/04/21 1203  BP: (!) 142/87 (!) 167/94 (!) 172/89 138/86  Pulse: 82 87 100 96  Resp: 18  16 20   Temp: 98.9 F (37.2 C) 98 F (36.7 C) 98.2 F (36.8 C) 99.5 F (37.5 C)  TempSrc: Axillary Oral Oral Oral  SpO2: 98%  93% 95%  Weight:      Height:       Physical Exam Constitutional:      General: She is not in acute distress.    Appearance: She is not ill-appearing.  HENT:     Head: Normocephalic and atraumatic.     Right Ear: External ear normal.     Left Ear: External ear normal.     Mouth/Throat:     Mouth: Mucous membranes are dry.     Pharynx: Oropharynx is clear. No posterior oropharyngeal erythema.  Eyes:     General: No scleral icterus. Cardiovascular:     Rate and Rhythm: Normal rate and regular rhythm.     Pulses: Normal pulses.     Heart sounds: Normal heart sounds.  Pulmonary:     Effort: Pulmonary effort is normal.     Breath sounds: Rales present.  Abdominal:     General: Bowel sounds are normal.     Palpations: Abdomen is soft.  Musculoskeletal:        General: No tenderness. Normal range of motion.     Cervical back: Normal range of motion and neck supple.     Right lower leg: No edema.     Left lower  leg: No edema.  Skin:    General: Skin is warm and dry.     Capillary Refill: Capillary refill takes less than 2 seconds.     Coloration: Skin is not jaundiced.  Neurological:     General: No focal deficit present.     Mental Status: She is alert and oriented to person, place, and time.     Cranial Nerves: No cranial nerve deficit.     Motor: No weakness (ULE=5/5, URE=5/5, LRE=5/5, LLE=5/5).  Psychiatric:        Mood and Affect: Mood normal.        Behavior: Behavior normal.     Assessment/Plan:  Active Problems:   Severe hypertension   Hypertensive urgency  Severe Hypertension Initial presentation was concerning for CVA however MRI showed no evidence of acute intracranial abnormality. Patient reports improvement in symptoms since presentation to ED and with gradual BP control. Patient states that she is adherent to home medications for BP including carvedilol, isosorbide mononitrate, lisinopril, HCTZ, and amlodipine. She states that when she does check her BP at home that it runs in the 150s+. MRI and CT scan rule out acute  stroke on admission. Patient had history of PRES syndrome in 2016. Blood pressures are labile for this patient as she quickly became hypertensive after with-holding Coreg for 1 dose. She became normotensive quickly after her hypertensive emergency as well. Will continue to monitor. Her anxiety could be contributing to the lability of the blood pressure.   Plan: -Change BP medications (Coreg 25 mg BID, Norvasc 5 mg) to only Coreg 25 mg BID. -Daily BMP -Continue to keep blood pressure sys<180.  -Currently hypertensive 185/91. -Lopressor 12.5 one dose for bp -With labile BPs and tachycardia and SOB, will rule out PE with CTA chest   -PE negative, but showing left lower and right upper lobe pneumonia -Hydroxyzine 50 mg TID for anxiety   COVID 19 Pneumonia Patient had incidental COVID infection during this ED visit. She did not endorse any symptoms initially but has  symptoms that are consistent with a COVID infection including congestion, nausea, and vomiting, with recent development of cough and diffuse muscle aches. She started having sputum production that is dark brown in color. Also developed righted sided back pain.  Medication scheduled for pain and cough as she has not received them in prn status.   Plan: -Continue to monitor patient for symptom development -Symptom management (Tylenol 650 mg scheduled q6hrs for pain and fever), -Mucinex DM scheduled for congestion, cough -CXR for cough with sputum production  -right upper lobe infiltrates present suggesting COVID pneumonia -Patient on Plaxovid  -Started Decadron 6 mg daily (2/10 days)  History of diabetes mellitus/peripheral neuropathy Patient has history of diabetes mellitus but denies home management of diabetes and is unsure of last HbA1c though states that she remembers it being high. She has symptoms of peripheral neuropathy. She was started on glipizide 10 mg, Linagliptin 5 mg, and novolog SS insulin. She had two episodes of hypoglycemia so adjustments were made to her regimen. On decadron 10 mg so some elevation expected.   Plan: - Check HbA1c  -A1c is 7.0 -Continue novolog SS insulin three times daily with meals -Adding 2 units meal time due to decadron course.  -Gabapentin 300 mg TID  History of DVT Patient has a history of DVT and is currently on Lovenox 40 mg daily prophylaxis.  Plan: -Continue Lovenox 40 mg daily  History of Stroke Patient has history of PRES syndrome in 2016. She states this appears similar to that feeling. MRI and CT for stroke have been negative so far. Currently muscle weakness has resolved but patient has fatigue due to COVID symptoms.   Plan: -Rosuvastatin 40 mg daily held as patient is on Plaxovid  Urinary urgency Patient complained of urinary urgency that has been recent where she is soiling her pants instead of making it to the bathroom. She stated  it started 06/28/21. She is on a catheter so it is hard to assess.   Plan: -Continue to monitor -Urinalysis  -negative for urinalysis -Post void residual volume  -0 ml post void volume -Follow up outpatient  Prior to Admission Living Arrangement: Anticipated Discharge Location: Barriers to Discharge: Dispo: Anticipated discharge in approximately 2 day(s).   Gwenevere Abbot, MD 07/04/2021, 4:46 PM

## 2021-07-04 NOTE — Progress Notes (Signed)
Pt noted to be anxious & c/o coughing, c/o meds this am. Pt refused Vistaril that was ordered this pm. Pt a little more calmer this evening. Pt's son Gerlene Burdock called and given update on pt and made aware of the above.

## 2021-07-04 NOTE — Evaluation (Signed)
Occupational Therapy Evaluation Patient Details Name: Mary Cuevas MRN: [id number] DOB: 01-30-1961 Today's Date: 07/04/2021    History of Present Illness Pt is a 59yo female presenting [facility] ED on 7/31 secondary to weakness and L facial droop. Likely secondary to severe HTN. Found to be +COVID-19 upon admission. Imaging revealed no acute abnormalities. Pt developed covid PNA.  PMH: HTN, DM, prior stroke, PRES, peripheral neuropathy (pt report from H&P).   Clinical Impression   Pt fatigued following PT, but demonstrated ability to perform ADL with up to min assist and to transfer with min guard assist. Provided recliner for pt and educated in importance of OOB activity, but she declined to remain up in chair. Pt with Sp02 93% on 2L 02 throughout session. Encouraged continued use of IS and flutter valve. Began educating pt in energy conservation strategies.     Follow Up Recommendations  Home health OT    Equipment Recommendations  None recommended by OT    Recommendations for Other Services       Precautions / Restrictions Precautions Precautions: Fall Restrictions Weight Bearing Restrictions: No      Mobility Bed Mobility Overal bed mobility: Needs Assistance Bed Mobility: Supine to Sit;Sit to Supine     Supine to sit: Min guard;HOB elevated Sit to supine: Min guard;HOB elevated   General bed mobility comments: Incr time to perform, assist for line management    Transfers Overall transfer level: Needs assistance Equipment used: None Transfers: Sit to/from [employer] Sit to Stand: Min guard Stand pivot transfers: Min guard       General transfer comment: bed >BSC>bed, chair provided and set up for pt, but she declined remaining up in chair    Balance Overall balance assessment: Needs assistance Sitting-balance support: No upper extremity supported;Feet supported Sitting balance-Leahy Scale: Good     Standing balance support: Single  extremity supported Standing balance-Leahy Scale: Poor Standing balance comment: UE support, at least on hand                           ADL either performed or assessed with clinical judgement   ADL Overall ADL's : Needs assistance/impaired Eating/Feeding: Independent;Bed level   Grooming: Wash/dry hands;Sitting;Set up   Upper Body Bathing: Minimal assistance;Sitting   Lower Body Bathing: Minimal assistance;Sit to/from stand   Upper Body Dressing : Set up;Sitting   Lower Body Dressing: Min guard;Sit to/from stand   Toilet Transfer: Min guard;BSC   Toileting- Architect and Hygiene: Min guard;Sit to/from stand         General ADL Comments: Pt had just completed PT session, fatigued, but agreed to demonstrate ADL skills and limited mobility.     Vision Patient Visual Report: No change from baseline       Perception     Praxis      Pertinent Vitals/Pain Pain Assessment: No/denies pain     Hand Dominance Left   Extremity/Trunk Assessment Upper Extremity Assessment Upper Extremity Assessment: Generalized weakness   Lower Extremity Assessment Lower Extremity Assessment: Defer to PT evaluation   Cervical / Trunk Assessment Cervical / Trunk Assessment: Normal   Communication Communication Communication: No difficulties   Cognition Arousal/Alertness: Awake/alert Behavior During Therapy: WFL for tasks assessed/performed;Anxious Overall Cognitive Status: Within Functional Limits for tasks assessed  General Comments: still perseverating on wondering why she got covid/that she had her vaccine, but redirectable. Generally anxious with movement but motivated.   General Comments  VSS on 2LPM    Exercises     Shoulder Instructions      Home Living Family/patient expects to be discharged to:: Private residence Living Arrangements: Non-relatives/Friends Available Help at Discharge: Available  PRN/intermittently;Friend(s) Type of Home: House Home Access: Stairs to enter [employer] of Steps: 3 Entrance Stairs-Rails: Left Home Layout: One level     Bathroom Shower/Tub: Producer, television/film/video: Handicapped height     Home Equipment: Cane - single point;Walker - 2 wheels   Additional Comments: Pt living with friend while remodeling mother's house.      Prior Functioning/Environment Level of Independence: Independent                 OT Problem List: Decreased strength;Decreased activity tolerance;Impaired balance (sitting and/or standing);Decreased knowledge of use of DME or AE;Cardiopulmonary status limiting activity      OT Treatment/Interventions: Self-care/ADL training;DME and/or AE instruction;Energy conservation;Patient/family education;Balance training    OT Goals(Current goals can be found in the care plan section) Acute Rehab OT Goals Patient Stated Goal: To feel stronger OT Goal Formulation: With patient Time For Goal Achievement: 07/18/21 Potential to Achieve Goals: Good ADL Goals Pt Will Perform Grooming: with modified independence;standing Pt Will Perform Upper Body Bathing: with modified independence;sitting Pt Will Perform Lower Body Bathing: with modified independence;sit to/from stand Pt Will Perform Upper Body Dressing: with set-up;sitting Pt Will Perform Lower Body Dressing: with set-up;sit to/from stand Pt Will Transfer to Toilet: with modified independence;ambulating;bedside commode (over toilet) Additional ADL Goal #1: Pt will utilize energy conservation strategies and pursed lip breathing during ADL and mobility.  OT Frequency: Min 2X/week   Barriers to D/C:            Co-evaluation              AM-PAC OT "6 Clicks" Daily Activity     Outcome Measure Help from another person eating meals?: None Help from another person taking care of personal grooming?: A Little Help from another person toileting, which  includes using toliet, bedpan, or urinal?: A Little Help from another person bathing (including washing, rinsing, drying)?: A Little Help from another person to put on and taking off regular upper body clothing?: None Help from another person to put on and taking off regular lower body clothing?: A Little 6 Click Score: 20   End of Session Equipment Utilized During Treatment: Oxygen (2L)  Activity Tolerance: Patient limited by fatigue Patient left: in bed;with call bell/phone within reach;with bed alarm set  OT Visit Diagnosis: Unsteadiness on feet (R26.81);Other abnormalities of gait and mobility (R26.89);Muscle weakness (generalized) (M62.81) (decreased activity tolerance)                Time: 1450-1510 OT Time Calculation (min): 20 min Charges:  OT General Charges $OT Visit: 1 Visit OT Evaluation $OT Eval Moderate Complexity: 1 Mod  Martie Round, OTR/L Acute Rehabilitation Services Pager: [phone] Office: [phone]   Evern Bio 07/04/2021, 3:42 PM

## 2021-07-04 NOTE — Progress Notes (Signed)
Physical Therapy Treatment Patient Details Name: Mary Cuevas MRN: [id number] DOB: May 01, 1961 Today's Date: 07/04/2021    History of Present Illness Pt is a 60yo female presenting to [facility] ED on 7/31 secondary to weakness and L facial droop. Likely secondary to severe HTN. Found to be +COVID-19 upon admission. Imaging revealed no acute abnormalities. Pt developed covid PNA.  PMH: HTN, DM, prior stroke, PRES, peripheral neuropathy (pt report from H&P).    PT Comments    Patient received in bed, pleasant and cooperative, anxious with mobility but motivated to try to return home. Very weak and tremulous, but able to mobilize on a min guard basis with RW and cues for safety/correct technique. Progressed gait distance, but still very fatigued by in room activities. VSS on 2LPM, but was dizzy with positional transitions- would recommend orthostatics check if not already performed. No chair available at time of PT session, will f/u on this as well- left in bed with all needs met this afternoon. Will continue to follow.     Follow Up Recommendations  Home health PT;Supervision for mobility/OOB     Equipment Recommendations  Rolling walker with 5" wheels;3in1 (PT);Wheelchair (measurements PT);Wheelchair cushion (measurements PT)    Recommendations for Other Services       Precautions / Restrictions Precautions Precautions: Fall Restrictions Weight Bearing Restrictions: No    Mobility  Bed Mobility Overal bed mobility: Needs Assistance Bed Mobility: Supine to Sit;Sit to Supine     Supine to sit: Min guard;HOB elevated Sit to supine: Min guard;HOB elevated   General bed mobility comments: Incr time to perform, assist for line management    Transfers Overall transfer level: Needs assistance Equipment used: Rolling walker (2 wheeled) Transfers: Sit to/from Stand Sit to Stand: Min guard         General transfer comment: min guard for safety wtih cues for hand placement-  increased time and extra effort  Ambulation/Gait Ambulation/Gait assistance: Min guard Gait Distance (Feet): 15 Feet Assistive device: Rolling walker (2 wheeled) Gait Pattern/deviations: Step-through pattern;Decreased step length - right;Decreased step length - left;Shuffle;Trunk flexed Gait velocity: decr   General Gait Details: very weak and tremulous even with RW- needed significant amount of increased time, gait even short distances in room very effortful. VSS on 2LPM O2.   Stairs             Wheelchair Mobility    Modified Rankin (Stroke Patients Only)       Balance Overall balance assessment: Needs assistance Sitting-balance support: No upper extremity supported;Feet supported Sitting balance-Leahy Scale: Good     Standing balance support: Single extremity supported Standing balance-Leahy Scale: Poor Standing balance comment: UE support                            Cognition Arousal/Alertness: Awake/alert Behavior During Therapy: WFL for tasks assessed/performed;Anxious Overall Cognitive Status: Within Functional Limits for tasks assessed                                 General Comments: still perseverating on wondering why she got covid/that she had her vaccine, but redirectable. Generally anxious with movement but motivated.      Exercises      General Comments General comments (skin integrity, edema, etc.): VSS on 2LPM      Pertinent Vitals/Pain Pain Assessment: No/denies pain    Home Living  Prior Function            PT Goals (current goals can now be found in the care plan section) Acute Rehab PT Goals Patient Stated Goal: To feel stronger PT Goal Formulation: With patient Time For Goal Achievement: 07/15/21 Potential to Achieve Goals: Fair Progress towards PT goals: Progressing toward goals    Frequency    Min 3X/week      PT Plan Equipment recommendations need to be updated     Co-evaluation              AM-PAC PT "6 Clicks" Mobility   Outcome Measure  Help needed turning from your back to your side while in a flat bed without using bedrails?: A Little Help needed moving from lying on your back to sitting on the side of a flat bed without using bedrails?: A Little Help needed moving to and from a bed to a chair (including a wheelchair)?: A Little Help needed standing up from a chair using your arms (e.g., wheelchair or bedside chair)?: A Little Help needed to walk in hospital room?: A Little Help needed climbing 3-5 steps with a railing? : A Lot 6 Click Score: 17    End of Session Equipment Utilized During Treatment: Oxygen Activity Tolerance: Patient tolerated treatment well Patient left: in bed;with call bell/phone within reach Nurse Communication: Mobility status PT Visit Diagnosis: Muscle weakness (generalized) (M62.81);Unsteadiness on feet (R26.81)     Time: 2019-9241 PT Time Calculation (min) (ACUTE ONLY): 30 min  Charges:  $Gait Training: 8-22 mins $Therapeutic Activity: 8-22 mins                    Windell Norfolk, DPT, PN2   Supplemental Physical Therapist [place]    Pager [phone] Acute Rehab Office [phone]

## 2021-07-05 DIAGNOSIS — J1282 Pneumonia due to coronavirus disease 2019: Secondary | ICD-10-CM | POA: Diagnosis not present

## 2021-07-05 DIAGNOSIS — U071 COVID-19: Secondary | ICD-10-CM

## 2021-07-05 LAB — BASIC METABOLIC PANEL
Anion gap: 8 (ref 5–15)
BUN: 13 mg/dL (ref 6–20)
CO2: 22 mmol/L (ref 22–32)
Calcium: 9.1 mg/dL (ref 8.9–10.3)
Chloride: 107 mmol/L (ref 98–111)
Creatinine, Ser: 0.96 mg/dL (ref 0.44–1.00)
GFR, Estimated: >60 mL/min (ref >60)
Glucose, Bld: 286 mg/dL — ABNORMAL HIGH (ref 70–99)
Potassium: 3.8 mmol/L (ref 3.5–5.1)
Sodium: 137 mmol/L (ref 135–145)

## 2021-07-05 LAB — CBC
HCT: 28.2 % — ABNORMAL LOW (ref 36.0–46.0)
Hemoglobin: 9.6 g/dL — ABNORMAL LOW (ref 12.0–15.0)
MCH: 27.2 pg (ref 26.0–34.0)
MCHC: 34 g/dL (ref 30.0–36.0)
MCV: 79.9 fL — ABNORMAL LOW (ref 80.0–100.0)
Platelets: 237 10*3/uL (ref 150–400)
RBC: 3.53 MIL/uL — ABNORMAL LOW (ref 3.87–5.11)
RDW: 14.4 % (ref 11.5–15.5)
WBC: 8.7 10*3/uL (ref 4.0–10.5)
nRBC: 0 % (ref 0.0–0.2)

## 2021-07-05 LAB — MAGNESIUM: Magnesium: 2 mg/dL (ref 1.7–2.4)

## 2021-07-05 LAB — GLUCOSE, CAPILLARY
Glucose-Capillary: 168 mg/dL — ABNORMAL HIGH (ref 70–99)
Glucose-Capillary: 179 mg/dL — ABNORMAL HIGH (ref 70–99)
Glucose-Capillary: 193 mg/dL — ABNORMAL HIGH (ref 70–99)
Glucose-Capillary: 205 mg/dL — ABNORMAL HIGH (ref 70–99)

## 2021-07-05 LAB — FERRITIN: Ferritin: 1434 ng/mL — ABNORMAL HIGH (ref 11–307)

## 2021-07-05 LAB — D-DIMER, QUANTITATIVE: D-Dimer, Quant: 1.36 ug/mL-FEU — ABNORMAL HIGH (ref 0.00–0.50)

## 2021-07-05 LAB — C-REACTIVE PROTEIN: CRP: 36.3 mg/dL — ABNORMAL HIGH (ref <1.0)

## 2021-07-05 MED ORDER — INSULIN ASPART 100 UNIT/ML IJ SOLN
0.0000 [IU] | Freq: Three times a day (TID) | INTRAMUSCULAR | Status: DC
  Administered 2021-07-05 (×3): 3 [IU] via SUBCUTANEOUS
  Administered 2021-07-06: 8 [IU] via SUBCUTANEOUS
  Administered 2021-07-06: 3 [IU] via SUBCUTANEOUS
  Administered 2021-07-07: 2 [IU] via SUBCUTANEOUS
  Administered 2021-07-07: 3 [IU] via SUBCUTANEOUS
  Administered 2021-07-07: 8 [IU] via SUBCUTANEOUS
  Administered 2021-07-08: 15 [IU] via SUBCUTANEOUS
  Administered 2021-07-08: 5 [IU] via SUBCUTANEOUS
  Administered 2021-07-08 – 2021-07-09 (×2): 8 [IU] via SUBCUTANEOUS
  Administered 2021-07-09: 11 [IU] via SUBCUTANEOUS

## 2021-07-05 MED ORDER — DEXAMETHASONE SODIUM PHOSPHATE 10 MG/ML IJ SOLN
6.0000 mg | Freq: Every day | INTRAMUSCULAR | Status: DC
  Administered 2021-07-05: 6 mg via INTRAVENOUS
  Filled 2021-07-05: qty 1

## 2021-07-05 MED ORDER — AMLODIPINE BESYLATE 5 MG PO TABS
5.0000 mg | ORAL_TABLET | Freq: Every day | ORAL | Status: DC
  Administered 2021-07-05 – 2021-07-06 (×2): 5 mg via ORAL
  Filled 2021-07-05: qty 1

## 2021-07-05 NOTE — Progress Notes (Signed)
Pt oob today with standby assist & sit up in chair for approx 6 hrs & tolerated well.O2 cont @ 2L/min via [place], No resp distress noted.

## 2021-07-05 NOTE — Progress Notes (Signed)
   Subjective:  Patient evaluated at bedside this AM. Mentions her cough has been bothering her, but otherwise breathing well. She continues to be very anxious regarding COVID-19 diagnosis, mentioning that she was vaccinated x3 and was taking all of the right precautions. Offered reassurance that she appears to be doing well.  Objective:  Vital signs in last 24 hours: Vitals:   07/04/21 1700 07/04/21 2027 07/05/21 0005 07/05/21 0412  BP: (!) 143/85 140/90 (!) 153/95 (!) 153/93  Pulse: 89 91 81 93  Resp: 20 20 19 14   Temp: 99 F (37.2 C) 98.1 F (36.7 C) (!) 97.4 F (36.3 C) 98 F (36.7 C)  TempSrc: Oral Oral Oral Oral  SpO2: 96% 96% 95%   Weight:      Height:       Physical Exam: General: Anxious appearing, laying in bed in no acute distress CV: Regular rate, rhythm. No murmurs, rubs, gallops appreciated Pulm: Mild increase work of breathing on 2L nasal cannula. Clear to auscultation bilaterally. MSK: Normal bulk, tone. No pitting edema bilaterally. Skin: Warm, dry. Neuro: Awake, alert, conversing appropriately. Psych: Anxious, normal speech.  Assessment/Plan:  Arriel Victor is 59yo person with hypertension, type 2 diabetes mellitus, previous DVT admitted 8/1 with severe asymptomatic hypertension and found to have COVID-19 pneumonia, continuing to clinically improve.  Principal Problem:   Pneumonia due to COVID-19 virus Active Problems:   Severe hypertension   Hypertensive urgency  #Severe asymptomatic hypertension Patient has remarkable history of uncontrolled hypertension with compliance with medications. She initially presented with BP 204/120. This has improved since this time, now with SBP 140-160. Previously had trialed re-starting home amlodipine, but patient became hypotensive, possibly due to infection. Pressures have been very labile. Believe her anxiety is also playing a role in this. Today will re-start home amlodipine at half dose. Can up-titrate as needed. -  Start home amlodipine 5mg  daily - Continue carvedilol 25mg  twice daily  #COVID-19 pneumonia Patient continues to require 2L O2 on nasal cannula. Overall, however, she appears to be improving. Today is her last day of Paxlovid. Will also d/c decadron, as this could be contributing to her anxiety.  - D/c Paxlovid, decadron - Incentive spirometry - Wean off O2 as tolerated - Will stop checking inflammatory markers - Continue Mucinex as needed for cough and congestion  #Anxiety Over at least the past two days, patient has been extremely anxious over diagnosis of COVID-19. She reports that she feels like she needs hospital gowns at discharge to prevent further COVID-19 infections. Hydroxyzine was initiated yesterday, although patient has refused these. Can give low dose ativan if needed. - Hydroxyzine 50mg  three times daily if amenable   #Type 2 diabetes mellitus A1c 7.0% on admission. With steroids, sugars have not been well-controlled. However, we will stop steroids today. Will continue with regimen today, likely can de-escalate tomorrow. - Glargine 5U nightly - SSI  Prior to Admission Living Arrangement: home Anticipated Discharge Location: home Barriers to Discharge: medical management Dispo: Anticipated discharge in approximately 1-2 day(s).   10/1, MD 07/05/2021, 6:58 AM Pager: [phone] After 5pm on weekdays and 1pm on weekends: On Call pager [phone]

## 2021-07-06 LAB — GLUCOSE, CAPILLARY
Glucose-Capillary: 174 mg/dL — ABNORMAL HIGH (ref 70–99)
Glucose-Capillary: 255 mg/dL — ABNORMAL HIGH (ref 70–99)
Glucose-Capillary: 132 mg/dL — ABNORMAL HIGH (ref 70–99)

## 2021-07-06 LAB — BASIC METABOLIC PANEL
Anion gap: 9 (ref 5–15)
BUN: 17 mg/dL (ref 6–20)
CO2: 24 mmol/L (ref 22–32)
Calcium: 9 mg/dL (ref 8.9–10.3)
Chloride: 104 mmol/L (ref 98–111)
Creatinine, Ser: 0.9 mg/dL (ref 0.44–1.00)
GFR, Estimated: >60 mL/min (ref >60)
Glucose, Bld: 229 mg/dL — ABNORMAL HIGH (ref 70–99)
Potassium: 3.7 mmol/L (ref 3.5–5.1)
Sodium: 137 mmol/L (ref 135–145)

## 2021-07-06 LAB — CBC
HCT: 28.1 % — ABNORMAL LOW (ref 36.0–46.0)
Hemoglobin: 9.5 g/dL — ABNORMAL LOW (ref 12.0–15.0)
MCH: 26.8 pg (ref 26.0–34.0)
MCHC: 33.8 g/dL (ref 30.0–36.0)
MCV: 79.4 fL — ABNORMAL LOW (ref 80.0–100.0)
Platelets: 293 10*3/uL (ref 150–400)
RBC: 3.54 MIL/uL — ABNORMAL LOW (ref 3.87–5.11)
RDW: 14.4 % (ref 11.5–15.5)
WBC: 11.4 10*3/uL — ABNORMAL HIGH (ref 4.0–10.5)
nRBC: 0.2 % (ref 0.0–0.2)

## 2021-07-06 LAB — D-DIMER, QUANTITATIVE: D-Dimer, Quant: 2.18 ug/mL-FEU — ABNORMAL HIGH (ref 0.00–0.50)

## 2021-07-06 LAB — C-REACTIVE PROTEIN: CRP: 36 mg/dL — ABNORMAL HIGH (ref <1.0)

## 2021-07-06 LAB — MAGNESIUM: Magnesium: 2.2 mg/dL (ref 1.7–2.4)

## 2021-07-06 LAB — FERRITIN: Ferritin: 1472 ng/mL — ABNORMAL HIGH (ref 11–307)

## 2021-07-06 MED ORDER — AMLODIPINE BESYLATE 10 MG PO TABS
10.0000 mg | ORAL_TABLET | Freq: Every day | ORAL | Status: DC
  Administered 2021-07-07 – 2021-07-09 (×3): 10 mg via ORAL
  Filled 2021-07-06 (×4): qty 1

## 2021-07-06 MED ORDER — POLYETHYLENE GLYCOL 3350 17 G PO PACK
17.0000 g | PACK | Freq: Every day | ORAL | Status: DC
  Administered 2021-07-06 – 2021-07-09 (×3): 17 g via ORAL
  Filled 2021-07-06 (×3): qty 1

## 2021-07-06 MED ORDER — HYDROXYZINE HCL 25 MG PO TABS
25.0000 mg | ORAL_TABLET | Freq: Three times a day (TID) | ORAL | Status: DC
  Administered 2021-07-07 – 2021-07-08 (×6): 25 mg via ORAL
  Filled 2021-07-06 (×8): qty 1

## 2021-07-06 MED ORDER — AMLODIPINE BESYLATE 5 MG PO TABS
5.0000 mg | ORAL_TABLET | Freq: Once | ORAL | Status: AC
  Administered 2021-07-06: 5 mg via ORAL
  Filled 2021-07-06: qty 1

## 2021-07-06 MED ORDER — INSULIN DETEMIR 100 UNIT/ML ~~LOC~~ SOLN
5.0000 [IU] | Freq: Every day | SUBCUTANEOUS | Status: DC
  Administered 2021-07-06 – 2021-07-07 (×2): 5 [IU] via SUBCUTANEOUS
  Filled 2021-07-06 (×3): qty 0.05

## 2021-07-06 NOTE — Progress Notes (Signed)
Mary Cuevas is 60 year old female presenting with hypertensive emergency and weakness. She has PMHx of htn, diabetes, PRES, and DVT. She was found to have COVID 19 on admission and is currently symptomatic from that. Pt on Paxlovid.    Subjective:  Patient seen at bedside. She was coughing when I went in. Asked how she is doing, she stated she is coughing a lot and that kept her up at night. She states coughing is bothering her the most. Denied fevers, chills, nausea, vomiting, or SOB.   Overnight: No acute events overnight.  Objective:  Vital signs in last 24 hours: Vitals:   07/05/21 2137 07/06/21 0005 07/06/21 0429 07/06/21 0436  BP: (!) 155/91 (!) 146/91 (!) 152/89   Pulse:  91 94   Resp: 20 17 19    Temp: 97.9 F (36.6 C) 98 F (36.7 C) 98.1 F (36.7 C)   TempSrc: Oral Oral Oral   SpO2: 94% 96% 93%   Weight:    70.1 kg  Height:       Physical Exam Constitutional:      General: She is not in acute distress.    Appearance: She is not ill-appearing.  HENT:     Head: Normocephalic and atraumatic.     Right Ear: External ear normal.     Left Ear: External ear normal.     Mouth/Throat:     Mouth: Mucous membranes are dry.     Pharynx: Oropharynx is clear. No posterior oropharyngeal erythema.  Eyes:     General: No scleral icterus. Cardiovascular:     Rate and Rhythm: Normal rate and regular rhythm.     Pulses: Normal pulses.     Heart sounds: Normal heart sounds.  Pulmonary:     Effort: Pulmonary effort is normal.     Breath sounds: Rales present.  Abdominal:     General: Bowel sounds are normal.     Palpations: Abdomen is soft.  Musculoskeletal:        General: No tenderness. Normal range of motion.     Cervical back: Normal range of motion and neck supple.     Right lower leg: No edema.     Left lower leg: No edema.  Skin:    General: Skin is warm and dry.     Capillary Refill: Capillary refill takes less than 2 seconds.     Coloration: Skin is not jaundiced.   Neurological:     General: No focal deficit present.     Mental Status: She is alert and oriented to person, place, and time.     Cranial Nerves: No cranial nerve deficit.     Motor: No weakness (ULE=5/5, URE=5/5, LRE=5/5, LLE=5/5).  Psychiatric:        Mood and Affect: Mood normal.        Behavior: Behavior normal.     Assessment/Plan:  Principal Problem:   Pneumonia due to COVID-19 virus Active Problems:   Severe hypertension   Hypertensive urgency  Severe Hypertension Initial presentation was concerning for CVA however MRI showed no evidence of acute intracranial abnormality. Patient reports improvement in symptoms since presentation to ED and with gradual BP control. Patient states that she is adherent to home medications for BP including carvedilol, isosorbide mononitrate, lisinopril, HCTZ, and amlodipine. She states that when she does check her BP at home that it runs in the 150s+. MRI and CT scan rule out acute stroke on admission. Patient had history of PRES syndrome in 2016. Blood pressures  are labile for this patient as she quickly became hypertensive after with-holding Coreg for 1 dose. She became normotensive quickly after her hypertensive emergency as well. Will continue to monitor. Her anxiety could be contributing to the lability of the blood pressure.   Plan: -Start home BP meds Coreg 25 mg BID, and Norvasc 10 mg.  -Daily BMP -Continue to keep blood pressure sys<180.  -Currently hypertensive 165/99. -With labile BPs and tachycardia and SOB, will rule out PE with CTA chest   -PE negative, but showing left lower and right upper lobe pneumonia -Hydroxyzine 50 mg TID for anxiety   -Patient took first dose today (07/06/21)  COVID 19 Pneumonia Patient had incidental COVID infection during this ED visit. She did not endorse any symptoms initially but has symptoms that are consistent with a COVID infection including congestion, nausea, and vomiting, with recent development  of cough and diffuse muscle aches. She started having sputum production that is dark brown in color. Also developed righted sided back pain.  Medication scheduled for pain and cough as she has not received them in prn status. Currently doing better but has recently developed a mild leukocytosis, along with increase in acute inflammation markers, will monitor for any bacterial pneumonia secondary to viral pneumonia.  Plan: -Continue to monitor patient for symptom development -Symptom management (Tylenol 650 mg scheduled q6hrs for pain and fever), -Mucinex DM scheduled for congestion, cough  Tessalon pearls 200 mg TID -CXR for cough with sputum production (08/03)  -right upper lobe infiltrates present suggesting COVID pneumonia -Repeat CXR if worsening clinical presentation  History of diabetes mellitus/peripheral neuropathy Patient has history of diabetes mellitus and states her home regimen is glipizide 10 mg, Linagliptin 5 mg. She has symptoms of peripheral neuropathy. She was started on glipizide 10 mg, Linagliptin 5 mg, and novolog SS insulin. She had two episodes of hypoglycemia so adjustments were made to her regimen. She was on decadron which was stopped yesterday, so she will have better control without adjustments.   Plan: - Check HbA1c  -A1c is 7.0 -Continue novolog SS insulin three times daily with meals -Adding 5 units of insulin glargine -Gabapentin 300 mg TID  History of DVT Patient has a history of DVT and is currently on Lovenox 40 mg daily prophylaxis.  Plan: -Continue Lovenox 40 mg daily  History of Stroke Patient has history of PRES syndrome in 2016. She states this appears similar to that feeling. MRI and CT for stroke have been negative so far. Currently muscle weakness has resolved but patient has fatigue due to COVID symptoms.   Plan: -Rosuvastatin 40 mg daily held as patient is on Plaxovid -Will check her liver profile and restart rosuvastatin  tomorrow.  Urinary urgency Patient complained of urinary urgency that has been recent where she is soiling her pants instead of making it to the bathroom. She stated it started 06/28/21. She is on a catheter so it is hard to assess.   Plan: -Continue to monitor -Urinalysis  -negative for urinalysis -Post void residual volume  -0 ml post void volume -Follow up outpatient  Prior to Admission Living Arrangement: Anticipated Discharge Location: Barriers to Discharge: Dispo: Anticipated discharge in approximately 1-2 day(s).   Gwenevere Abbot, MD 07/06/2021, 6:22 AM

## 2021-07-06 NOTE — Progress Notes (Signed)
Pt refused CBG this morning, Pt stated she wanted to wait till lunch time to get blood sugar. Will continue to monitor pt. Tacey Ruiz RN

## 2021-07-07 DIAGNOSIS — J1282 Pneumonia due to coronavirus disease 2019: Secondary | ICD-10-CM | POA: Diagnosis not present

## 2021-07-07 DIAGNOSIS — U071 COVID-19: Secondary | ICD-10-CM | POA: Diagnosis not present

## 2021-07-07 LAB — BASIC METABOLIC PANEL
Anion gap: 9 (ref 5–15)
BUN: 14 mg/dL (ref 6–20)
CO2: 24 mmol/L (ref 22–32)
Calcium: 8.6 mg/dL — ABNORMAL LOW (ref 8.9–10.3)
Chloride: 102 mmol/L (ref 98–111)
Creatinine, Ser: 0.8 mg/dL (ref 0.44–1.00)
GFR, Estimated: >60 mL/min (ref >60)
Glucose, Bld: 134 mg/dL — ABNORMAL HIGH (ref 70–99)
Potassium: 3.8 mmol/L (ref 3.5–5.1)
Sodium: 135 mmol/L (ref 135–145)

## 2021-07-07 LAB — CBC
HCT: 28.4 % — ABNORMAL LOW (ref 36.0–46.0)
Hemoglobin: 9.5 g/dL — ABNORMAL LOW (ref 12.0–15.0)
MCH: 26.5 pg (ref 26.0–34.0)
MCHC: 33.5 g/dL (ref 30.0–36.0)
MCV: 79.3 fL — ABNORMAL LOW (ref 80.0–100.0)
Platelets: 305 10*3/uL (ref 150–400)
RBC: 3.58 MIL/uL — ABNORMAL LOW (ref 3.87–5.11)
RDW: 14.2 % (ref 11.5–15.5)
WBC: 16.3 10*3/uL — ABNORMAL HIGH (ref 4.0–10.5)
nRBC: 0.2 % (ref 0.0–0.2)

## 2021-07-07 LAB — GLUCOSE, CAPILLARY
Glucose-Capillary: 131 mg/dL — ABNORMAL HIGH (ref 70–99)
Glucose-Capillary: 166 mg/dL — ABNORMAL HIGH (ref 70–99)
Glucose-Capillary: 259 mg/dL — ABNORMAL HIGH (ref 70–99)

## 2021-07-07 LAB — D-DIMER, QUANTITATIVE: D-Dimer, Quant: 3.45 ug/mL-FEU — ABNORMAL HIGH (ref 0.00–0.50)

## 2021-07-07 LAB — C-REACTIVE PROTEIN: CRP: 42.7 mg/dL — ABNORMAL HIGH (ref <1.0)

## 2021-07-07 LAB — MAGNESIUM: Magnesium: 2.3 mg/dL (ref 1.7–2.4)

## 2021-07-07 LAB — FERRITIN: Ferritin: 1315 ng/mL — ABNORMAL HIGH (ref 11–307)

## 2021-07-07 MED ORDER — DEXTROMETHORPHAN POLISTIREX ER 30 MG/5ML PO SUER
30.0000 mg | Freq: Two times a day (BID) | ORAL | Status: DC
  Administered 2021-07-07 – 2021-07-09 (×5): 30 mg via ORAL
  Filled 2021-07-07 (×6): qty 5

## 2021-07-07 MED ORDER — DEXAMETHASONE SODIUM PHOSPHATE 10 MG/ML IJ SOLN
6.0000 mg | INTRAMUSCULAR | Status: DC
  Administered 2021-07-07 – 2021-07-09 (×3): 6 mg via INTRAVENOUS
  Filled 2021-07-07 (×3): qty 1

## 2021-07-07 MED ORDER — KETOROLAC TROMETHAMINE 30 MG/ML IJ SOLN
30.0000 mg | Freq: Once | INTRAMUSCULAR | Status: AC
  Administered 2021-07-07: 30 mg via INTRAVENOUS
  Filled 2021-07-07: qty 1

## 2021-07-07 NOTE — Progress Notes (Signed)
Mary Cuevas is 60 year old female presenting with hypertensive emergency and weakness. She has PMHx of htn, diabetes, PRES, and DVT. She was found to have COVID 19 on admission and is currently symptomatic from that. Pt completed Paxlovid on 08/06.    Subjective:  Patient seen at bedside. She was coughing when I went in. Asked how she is doing, she stated she is coughing a lot and that kept her up at night. She states coughing is bothering her the most. Coughing up yellow sputum. Wasn't able to quantify how much coughing she is doing but stated it is a lot. She she was getting rib pain from the coughing. Denied fevers, chills, nausea, vomiting, or SOB at rest.  Overnight: No acute events overnight.  Objective:  Vital signs in last 24 hours: Vitals:   07/06/21 2050 07/06/21 2344 07/07/21 0335 07/07/21 0339  BP: (!) 152/91 (!) 145/81  (!) 144/88  Pulse: 86 100  82  Resp: 18 (!) 23 20 16   Temp: 98.8 F (37.1 C) 99 F (37.2 C)  98 F (36.7 C)  TempSrc: Oral Axillary  Oral  SpO2: 98% 96%  96%  Weight:   65 kg 65 kg  Height:       Physical Exam Constitutional:      General: She is not in acute distress.    Appearance: She is not ill-appearing.     Comments: Appeared similar to yesterday with mild improvement  HENT:     Head: Normocephalic and atraumatic.     Right Ear: External ear normal.     Left Ear: External ear normal.     Mouth/Throat:     Mouth: Mucous membranes are moist.     Pharynx: Oropharynx is clear. No posterior oropharyngeal erythema.  Eyes:     General: No scleral icterus. Cardiovascular:     Rate and Rhythm: Normal rate and regular rhythm.     Pulses: Normal pulses.     Heart sounds: Normal heart sounds.  Pulmonary:     Effort: Pulmonary effort is normal.     Breath sounds: Decreased air movement (poor effort) present. Rales present.  Abdominal:     General: Bowel sounds are normal.     Palpations: Abdomen is soft.  Musculoskeletal:        General: No  tenderness. Normal range of motion.     Cervical back: Normal range of motion and neck supple.     Right lower leg: No edema.     Left lower leg: No edema.  Skin:    General: Skin is warm and dry.     Capillary Refill: Capillary refill takes less than 2 seconds.     Coloration: Skin is not jaundiced.  Neurological:     General: No focal deficit present.     Mental Status: She is alert and oriented to person, place, and time.     Cranial Nerves: No cranial nerve deficit.     Motor: No weakness.  Psychiatric:        Mood and Affect: Mood normal.        Behavior: Behavior normal.     Assessment/Plan:  Principal Problem:   Pneumonia due to COVID-19 virus Active Problems:   Severe hypertension   Hypertensive urgency  Severe Hypertension Initial presentation was concerning for CVA however MRI showed no evidence of acute intracranial abnormality. Patient reports improvement in symptoms since presentation to ED and with gradual BP control. Patient states that she is adherent to home  medications for BP including carvedilol, isosorbide mononitrate, lisinopril, HCTZ, and amlodipine. She states that when she does check her BP at home that it runs in the 150s+. MRI and CT scan rule out acute stroke on admission. Patient had history of PRES syndrome in 2016. Blood pressures are labile for this patient as she quickly became hypertensive after with-holding Coreg for 1 dose. She became normotensive quickly after her hypertensive emergency as well. Will continue to monitor. Her anxiety could be contributing to the lability of the blood pressure.   Plan: -Start home BP meds Coreg 25 mg BID, and Norvasc 10 mg.  -Daily BMP -Continue to keep blood pressure sys<180.  -Currently blood pressure 135/81 -With labile BPs and tachycardia and SOB, will rule out PE with CTA chest (07/04/21)   -PE negative, but showing left lower and right upper lobe pneumonia -Hydroxyzine 50 mg TID for anxiety   -Patient took  first dose today (07/06/21)  -Currently not taking and states not anxious  COVID 19 Pneumonia Patient had incidental COVID infection during this ED visit. She did not endorse any symptoms initially but has symptoms that are consistent with a COVID infection including congestion, nausea, and vomiting, with recent development of cough and diffuse muscle aches. She started having sputum production that is dark brown in color. Also developed righted sided back pain.  Medication scheduled for pain and cough as she has not received them in prn status. Currently doing better but has recently developed a mild leukocytosis, along with increase in acute inflammation markers, will monitor for any bacterial pneumonia secondary to viral pneumonia. Leukocytosis increased but patient clinically unchanged.   Plan: -Continue to monitor patient for symptom development -Symptom management (Tylenol 650 mg scheduled q6hrs for pain and fever), -Mucinex DM scheduled for congestion, cough  Tessalon pearls 200 mg TID -CXR for cough with sputum production (08/03)  -right upper lobe infiltrates present suggesting COVID pneumonia -Repeat CXR if worsening clinical presentation  History of diabetes mellitus/peripheral neuropathy Patient has history of diabetes mellitus and states her home regimen is glipizide 10 mg, Linagliptin 5 mg. She has symptoms of peripheral neuropathy. She was started on glipizide 10 mg, Linagliptin 5 mg, and novolog SS insulin. She had two episodes of hypoglycemia so adjustments were made to her regimen. She was on decadron which was stopped 08/06, but will resume today as it helped her with her breathing.  Plan: - Check HbA1c  -A1c is 7.0 -Continue novolog SS insulin three times daily with meals -Continuing 5 units of insulin glargine -Gabapentin 300 mg TID  History of DVT Patient has a history of DVT and is currently on Lovenox 40 mg daily prophylaxis.  Plan: -Continue Lovenox 40 mg  daily  History of Stroke Patient has history of PRES syndrome in 2016. She states this appears similar to that feeling. MRI and CT for stroke have been negative so far. Currently muscle weakness has resolved but patient has fatigue due to COVID symptoms.   Plan: -Rosuvastatin 40 mg daily held as patient is on Plaxovid -Will check her liver profile and restart rosuvastatin tomorrow.  Urinary urgency Patient complained of urinary urgency that has been recent where she is soiling her pants instead of making it to the bathroom. She stated it started 06/28/21. She is on a catheter so it is hard to assess.   Plan: -Continue to monitor -Urinalysis  -negative for urinalysis -Post void residual volume  -0 ml post void volume -Follow up outpatient  Prior to Admission  Living Arrangement: Anticipated Discharge Location: Barriers to Discharge: Dispo: Anticipated discharge in approximately 1 day.   Gwenevere Abbot, MD 07/07/2021, 6:42 AM

## 2021-07-07 NOTE — Progress Notes (Signed)
Physical Therapy Treatment Patient Details Name: Mary Cuevas MRN: [id number] DOB: September 16, 1961 Today's Date: 07/07/2021    History of Present Illness Pt is a 60yo female presenting to [facility] ED on 7/31 secondary to weakness and L facial droop. Likely secondary to severe HTN. Found to be +COVID-19 upon admission. Imaging revealed no acute abnormalities. Pt developed covid PNA.  PMH: HTN, DM, prior stroke, PRES, peripheral neuropathy (pt report from H&P).    PT Comments    Patient received in bed, immediately telling me she is not getting up or into the chair. When asked why/what's wrong, she tells me she does not like the chair because it makes her cough and she prefers to lay in the middle of the bed. Attempted to educate on benefits of OOB time/mobility for covid recovery, but she perseverated on having covid and her coughing- not amenable to redirection or education today. Offered chair position in bed as well as repositioning in bed, which she refused. Able to convince her to participate in limited bed exercises with mod cues for technique. Left in bed with all needs met this morning- will continue to follow but may need to consider SNF if progress with mobility remains slow.     Follow Up Recommendations  Home health PT;Supervision for mobility/OOB;Other (comment) (may need SNF if she is slow to progress)     Equipment Recommendations  Rolling walker with 5" wheels;3in1 (PT);Wheelchair (measurements PT);Wheelchair cushion (measurements PT)    Recommendations for Other Services       Precautions / Restrictions Precautions Precautions: Fall Precaution Comments: covid + Restrictions Weight Bearing Restrictions: No    Mobility  Bed Mobility               General bed mobility comments: refused    Transfers                 General transfer comment: refused  Ambulation/Gait             General Gait Details: refused   Stairs             Wheelchair  Mobility    Modified Rankin (Stroke Patients Only)       Balance                                            Cognition Arousal/Alertness: Awake/alert Behavior During Therapy: Anxious Overall Cognitive Status: Within Functional Limits for tasks assessed                                 General Comments: very anxious and perseverating on not wanting to move around because of her cough. Not redirectable- keeps perseverating and telling me about how getting up in the chair made her cough, how covid is worse for her than childbirth. Not receptive to redirection or education today.      Exercises      General Comments General comments (skin integrity, edema, etc.): refused EOB/OOB      Pertinent Vitals/Pain Pain Assessment: Faces Faces Pain Scale: Hurts little more Pain Location: generalized discomfort when coughing Pain Descriptors / Indicators: Discomfort Pain Intervention(s): Limited activity within patient's tolerance;Monitored during session    Home Living  Prior Function            PT Goals (current goals can now be found in the care plan section) Acute Rehab PT Goals Patient Stated Goal: To feel stronger PT Goal Formulation: With patient Time For Goal Achievement: 07/15/21 Potential to Achieve Goals: Fair Progress towards PT goals: Not progressing toward goals - comment (self limiting)    Frequency    Min 3X/week      PT Plan Current plan remains appropriate    Co-evaluation              AM-PAC PT "6 Clicks" Mobility   Outcome Measure  Help needed turning from your back to your side while in a flat bed without using bedrails?: A Little Help needed moving from lying on your back to sitting on the side of a flat bed without using bedrails?: A Little Help needed moving to and from a bed to a chair (including a wheelchair)?: A Little Help needed standing up from a chair using your arms  (e.g., wheelchair or bedside chair)?: A Little Help needed to walk in hospital room?: A Little Help needed climbing 3-5 steps with a railing? : A Lot 6 Click Score: 17    End of Session Equipment Utilized During Treatment: Oxygen Activity Tolerance: Other (comment) (extremely self limiting today) Patient left: in bed;with call bell/phone within reach Nurse Communication: Mobility status PT Visit Diagnosis: Muscle weakness (generalized) (M62.81);Unsteadiness on feet (R26.81)     Time: 0271-4232 PT Time Calculation (min) (ACUTE ONLY): 16 min  Charges:  $Therapeutic Exercise: 8-22 mins                    Windell Norfolk, DPT, PN2   Supplemental Physical Therapist [place]    Pager [phone] Acute Rehab Office [phone]

## 2021-07-08 LAB — COMPREHENSIVE METABOLIC PANEL
ALT: 24 U/L (ref 0–44)
AST: 17 U/L (ref 15–41)
Albumin: 2 g/dL — ABNORMAL LOW (ref 3.5–5.0)
Alkaline Phosphatase: 105 U/L (ref 38–126)
Anion gap: 10 (ref 5–15)
BUN: 23 mg/dL — ABNORMAL HIGH (ref 6–20)
CO2: 24 mmol/L (ref 22–32)
Calcium: 8.6 mg/dL — ABNORMAL LOW (ref 8.9–10.3)
Chloride: 101 mmol/L (ref 98–111)
Creatinine, Ser: 0.85 mg/dL (ref 0.44–1.00)
GFR, Estimated: >60 mL/min (ref >60)
Glucose, Bld: 304 mg/dL — ABNORMAL HIGH (ref 70–99)
Potassium: 4.1 mmol/L (ref 3.5–5.1)
Sodium: 135 mmol/L (ref 135–145)
Total Bilirubin: 0.6 mg/dL (ref 0.3–1.2)
Total Protein: 6.5 g/dL (ref 6.5–8.1)

## 2021-07-08 LAB — CBC WITH DIFFERENTIAL/PLATELET
Abs Immature Granulocytes: 0 10*3/uL (ref 0.00–0.07)
Basophils Absolute: 0 10*3/uL (ref 0.0–0.1)
Basophils Relative: 0 %
Eosinophils Absolute: 0 10*3/uL (ref 0.0–0.5)
Eosinophils Relative: 0 %
HCT: 31.7 % — ABNORMAL LOW (ref 36.0–46.0)
Hemoglobin: 10.6 g/dL — ABNORMAL LOW (ref 12.0–15.0)
Lymphocytes Relative: 4 %
Lymphs Abs: 0.9 10*3/uL (ref 0.7–4.0)
MCH: 27 pg (ref 26.0–34.0)
MCHC: 33.4 g/dL (ref 30.0–36.0)
MCV: 80.7 fL (ref 80.0–100.0)
Monocytes Absolute: 0 10*3/uL — ABNORMAL LOW (ref 0.1–1.0)
Monocytes Relative: 0 %
Neutro Abs: 22.8 10*3/uL — ABNORMAL HIGH (ref 1.7–7.7)
Neutrophils Relative %: 96 %
Platelets: 324 10*3/uL (ref 150–400)
RBC: 3.93 MIL/uL (ref 3.87–5.11)
RDW: 14.8 % (ref 11.5–15.5)
WBC: 23.7 10*3/uL — ABNORMAL HIGH (ref 4.0–10.5)
nRBC: 0 % (ref 0.0–0.2)
nRBC: 0 /100 WBC

## 2021-07-08 LAB — GLUCOSE, CAPILLARY
Glucose-Capillary: 276 mg/dL — ABNORMAL HIGH (ref 70–99)
Glucose-Capillary: 252 mg/dL — ABNORMAL HIGH (ref 70–99)
Glucose-Capillary: 394 mg/dL — ABNORMAL HIGH (ref 70–99)
Glucose-Capillary: 233 mg/dL — ABNORMAL HIGH (ref 70–99)
Glucose-Capillary: 233 mg/dL — ABNORMAL HIGH (ref 70–99)

## 2021-07-08 MED ORDER — HYDROCOD POLST-CPM POLST ER 10-8 MG/5ML PO SUER
5.0000 mL | Freq: Two times a day (BID) | ORAL | Status: DC
  Administered 2021-07-08 – 2021-07-09 (×2): 5 mL via ORAL
  Filled 2021-07-08 (×2): qty 5

## 2021-07-08 MED ORDER — HYDROCOD POLST-CPM POLST ER 10-8 MG/5ML PO SUER
5.0000 mL | Freq: Once | ORAL | Status: AC
Start: 2021-07-08 — End: 2021-07-08
  Administered 2021-07-08: 5 mL via ORAL
  Filled 2021-07-08 (×2): qty 5

## 2021-07-08 MED ORDER — INSULIN DETEMIR 100 UNIT/ML ~~LOC~~ SOLN
8.0000 [IU] | Freq: Every day | SUBCUTANEOUS | Status: DC
  Administered 2021-07-08: 8 [IU] via SUBCUTANEOUS
  Filled 2021-07-08 (×2): qty 0.08

## 2021-07-08 MED ORDER — HYDROCOD POLST-CPM POLST ER 10-8 MG/5ML PO SUER: 5.0000 mL | Freq: Every day | ORAL | Status: DC

## 2021-07-08 MED ORDER — ROSUVASTATIN CALCIUM 20 MG PO TABS
40.0000 mg | ORAL_TABLET | Freq: Every day | ORAL | Status: DC
  Administered 2021-07-08 – 2021-07-09 (×2): 40 mg via ORAL
  Filled 2021-07-08 (×2): qty 2

## 2021-07-08 NOTE — Progress Notes (Addendum)
[name] is 60 year old female presenting with hypertensive emergency and weakness. She has PMHx of htn, diabetes, PRES, and DVT. She was found to have COVID 19 on admission and is currently symptomatic from that. Pt completed Paxlovid on 08/06.    Subjective: Patient stated she was doing well. Appeared less anxious. She noticed significant improvement since yesterday. Coughing has improved since Tussionex given. Patient tolerated well except mild nausea. She said she coughed up specks of red sputum and had some rib pain upon coughing. Patient told to work with PT so she can go home soon.   Overnight: No acute events overnight. Patient complained of coughing but given Tussionex which helped her.   Objective:  Vital signs in last 24 hours: Vitals:   07/07/21 1609 07/07/21 1950 07/07/21 2303 07/08/21 0500  BP: 115/73 113/76 115/71 136/82  Pulse: 80 82 86 80  Resp: 19 20 19 20   Temp: 98.8 F (37.1 C) 98.6 F (37 C) 98 F (36.7 C) 98.2 F (36.8 C)  TempSrc: Oral Oral Oral Oral  SpO2: 94% 98% 92% 93%  Weight:      Height:       Physical Exam Constitutional:      Appearance: Normal appearance.  HENT:     Head: Normocephalic and atraumatic.     Right Ear: External ear normal.     Left Ear: External ear normal.     Mouth/Throat:     Mouth: Mucous membranes are moist.     Pharynx: Oropharynx is clear. No posterior oropharyngeal erythema.  Eyes:     Extraocular Movements: Extraocular movements intact.     Conjunctiva/sclera: Conjunctivae normal.  Cardiovascular:     Rate and Rhythm: Normal rate and regular rhythm.     Pulses: Normal pulses.     Heart sounds: Normal heart sounds.  Pulmonary:     Effort: No respiratory distress.     Breath sounds: No wheezing, rhonchi or rales.     Comments: Slight decrease air movement Chest:     Chest wall: Tenderness present.  Abdominal:     General: There is no distension.     Palpations: Abdomen is soft.     Tenderness: There is no  abdominal tenderness.  Musculoskeletal:        General: No swelling or tenderness. Normal range of motion.  Skin:    General: Skin is warm.     Findings: No erythema.  Neurological:     General: No focal deficit present.     Mental Status: She is alert and oriented to person, place, and time. Mental status is at baseline.     Motor: No weakness.  Psychiatric:        Mood and Affect: Mood normal.        Behavior: Behavior normal.     Assessment/Plan:  Principal Problem:   Pneumonia due to COVID-19 virus Active Problems:   Severe hypertension   Hypertensive urgency  COVID 19 Pneumonia Presented with symptoms that are consistent with a COVID infection including congestion, nausea, and vomiting, with recent development of cough and diffuse muscle aches. Currently doing better.   Leukocytosis increased but patient clinically improved so could be secondary to decadron.  Plan: -Continue to monitor patient for new symptom development -Symptom management (Tylenol 650 mg scheduled q6hrs for pain and fever), -Mucinex DM scheduled for congestion, cough -Dextromethorphan 30 mg BID -5 ml of 10-8 mg of Tussionex at night time   -Tessalon pearls 200 mg TID -Repeat CXR  if worsening clinical presentation -Encourage working with PT/OT for strength and hopeful discharge to home.  -Decadron 6 mg Day 4/10  Hypertension Patient presented initially with elevated blood pressures.  Patient had history of PRES syndrome in 2016. Blood pressures are labile for this patient as she quickly became hypertensive after with-holding Coreg for 1 dose. She became normotensive quickly after her hypertensive emergency as well. Will continue to monitor. Her anxiety could be contributing to the lability of the blood pressure.   Plan: -Continue home BP meds Coreg 25 mg BID, and Norvasc 10 mg.  -Daily BMP -Currently blood pressure 133/78 -With labile BPs and tachycardia and SOB, will rule out PE with CTA chest  (07/04/21)   -PE negative, but showing left lower and right upper lobe pneumonia -Hydroxyzine 50 mg TID for anxiety  -Patient took Tussionex last night which made her sleep and calmed her down.   History of diabetes mellitus/peripheral neuropathy Patient has history of diabetes mellitus and states her home regimen is glipizide 10 mg, Linagliptin 5 mg. She has symptoms of peripheral neuropathy. She was started on glipizide 10 mg, Linagliptin 5 mg, and novolog SS insulin. She had two episodes of hypoglycemia so adjustments were made to her regimen. She was on decadron which was stopped 08/06, but will resume today as it helped her with her breathing. Which could elevate her glucose.   Plan: -Continue novolog SS insulin three times daily with meals -Continuing 8 units of insulin glargine -Gabapentin 300 mg TID   History of Stroke Patient has history of PRES syndrome in 2016. She states this appears similar to that feeling. MRI and CT for stroke were negative on presentation. Currently muscle weakness has resolved but patient has fatigue due to COVID symptoms.   Plan: -Rosuvastatin 40 mg daily held as patient is on Plaxovid -Liver function wnl so will restart rosuvastatin  Urinary urgency Patient complained of urinary urgency that has been recent where she is soiling her pants instead of making it to the bathroom. She stated it started 06/28/21. She is on a catheter so it is hard to assess.   Plan: -Follow up outpatient  Prior to Admission Living Arrangement: Anticipated Discharge Location: Barriers to Discharge: Dispo: Anticipated discharge in approximately 1 day.   Gwenevere Abbot, MD 07/08/2021, 6:29 AM

## 2021-07-08 NOTE — Progress Notes (Signed)
Attempted to ambulate patient without oxygen. Patient wasn't able to ambulate passed the door of her room. She remained 89% on room air.   Placed her back on 2 liters. She rose to 93%

## 2021-07-08 NOTE — Plan of Care (Signed)
  Problem: Education: Goal: Knowledge of General Education information will improve Description: Including pain rating scale, medication(s)/side effects and non-pharmacologic comfort measures Outcome: Progressing   Problem: Clinical Measurements: Goal: Ability to maintain clinical measurements within normal limits will improve Outcome: Progressing Goal: Will remain free from infection Outcome: Progressing Goal: Cardiovascular complication will be avoided Outcome: Progressing   

## 2021-07-08 NOTE — Progress Notes (Signed)
Occupational Therapy Treatment Patient Details Name: Mary Cuevas MRN: [id number] DOB: [birth year] Today's Date: 07/08/2021    History of present illness Pt is a 59yo female presenting to [facility] ED on 7/31 secondary to weakness and L facial droop. Likely secondary to severe HTN. Found to be +COVID-19 upon admission. Imaging revealed no acute abnormalities. Pt developed covid PNA.  PMH: HTN, DM, prior stroke, PRES, peripheral neuropathy (pt report from H&P).   OT comments  Patient progressing and showed improved ability to stand for grooming tasks at sink compared to previous session where pt required a seated position for grooming due to fatigue. RPE of 5/10 at 2 grooming tasks while standing.  Patient remains limited by mildly impaired cognition, generalized weakness and decreased activity tolerance along with deficits noted below. Pt continues to demonstrate good rehab potential and would benefit from continued skilled OT to increase safety and independence with ADLs and functional transfers to allow pt to return home safely and reduce caregiver burden and fall risk.   Follow Up Recommendations  Home health OT;Supervision/Assistance - 24 hour    Equipment Recommendations  None recommended by OT    Recommendations for Other Services      Precautions / Restrictions Precautions Precautions: Fall Precaution Comments: covid + Restrictions Weight Bearing Restrictions: No       Mobility Bed Mobility Overal bed mobility: Needs Assistance Bed Mobility: Supine to Sit     Supine to sit: Min guard;HOB elevated          Transfers Overall transfer level: Needs assistance Equipment used: Rolling walker (2 wheeled) Transfers: Sit to/from [employer] Sit to Stand: Min guard Stand pivot transfers: Min guard            Balance Overall balance assessment: Needs assistance Sitting-balance support: No upper extremity supported;Feet supported Sitting balance-Leahy  Scale: Good     Standing balance support: Single extremity supported Standing balance-Leahy Scale: Fair Standing balance comment: Fair at sink with standing grooming.Close supervision to [facility] due to mild cognitive impairments.                           ADL either performed or assessed with clinical judgement   ADL Overall ADL's : Needs assistance/impaired     Grooming: Standing;Oral care;Wash/dry hands;Supervision/safety Grooming Details (indicate cue type and reason): Pt educated on lining RW up to sink for safety and pt able to stand and perform 2 grooming tasks in standing while on 1L O2 via NCa nd SpO2 at or above 89%. Did note some increased SHOB. Once in chair pt reported RPE of 5/10.             Lower Body Dressing: Sitting/lateral leans Lower Body Dressing Details (indicate cue type and reason): Pt educated to use figure 4 technique to avoid forward bending for energy conservation. Pt reports "I do that. I sit [place] style."             Functional mobility during ADLs: Min guard;Rolling walker General ADL Comments: RW used per pt request. Min guard for steadying with in-room ambulation ~5', then ~12' to recliner.     Vision Baseline Vision/History: No visual deficits Patient Visual Report: No change from baseline     Perception     Praxis      Cognition Arousal/Alertness: Awake/alert Behavior During Therapy: Flat affect Overall Cognitive Status: Within Functional Limits for tasks assessed  General Comments: Questionable comprehension of energy conservation education this session. Pt with some answers that were thinnly related to topic, and at times laughing inappropriately. Pt speaking at length on the education level of all of her family members. Seems to lack insight into her current deficits.        Exercises Other Exercises Other Exercises: Handout and education on energy conservation  techniques. Focused on pacing and gradually increasing activity level. Pt receptive but with questionable comprehension. Would benefit from reinforcement.   Shoulder Instructions       General Comments      Pertinent Vitals/ Pain       Pain Assessment: Faces Faces Pain Scale: Hurts a little bit Pain Location: when coughing Pain Descriptors / Indicators: Discomfort Pain Intervention(s): Limited activity within patient's tolerance;Monitored during session;Relaxation;Repositioned  Home Living                                          Prior Functioning/Environment              Frequency  Min 2X/week        Progress Toward Goals  OT Goals(current goals can now be found in the care plan section)  Progress towards OT goals: Progressing toward goals  Acute Rehab OT Goals Patient Stated Goal: Feel better OT Goal Formulation: With patient Time For Goal Achievement: 07/18/21 Potential to Achieve Goals: Good  Plan      Co-evaluation                 AM-PAC OT "6 Clicks" Daily Activity     Outcome Measure   Help from another person eating meals?: None Help from another person taking care of personal grooming?: A Little Help from another person toileting, which includes using toliet, bedpan, or urinal?: A Little Help from another person bathing (including washing, rinsing, drying)?: A Little Help from another person to put on and taking off regular upper body clothing?: None Help from another person to put on and taking off regular lower body clothing?: A Little 6 Click Score: 20    End of Session Equipment Utilized During Treatment: Oxygen (1L)  OT Visit Diagnosis: Unsteadiness on feet (R26.81);Other abnormalities of gait and mobility (R26.89);Muscle weakness (generalized) (M62.81)   Activity Tolerance Patient limited by fatigue   Patient Left in chair;with call bell/phone within reach;with chair alarm set   Nurse Communication Mobility  status        Time: 6948-5462 OT Time Calculation (min): 32 min  Charges: OT General Charges $OT Visit: 1 Visit OT Treatments $Self Care/Home Management : 8-22 mins $Therapeutic Activity: 8-22 mins  Victorino Dike, OT Acute Rehab Services Office: [phone] 07/08/2021   Theodoro Clock 07/08/2021, 12:44 PM

## 2021-07-09 LAB — BASIC METABOLIC PANEL
Anion gap: 9 (ref 5–15)
BUN: 30 mg/dL — ABNORMAL HIGH (ref 6–20)
CO2: 27 mmol/L (ref 22–32)
Calcium: 8.6 mg/dL — ABNORMAL LOW (ref 8.9–10.3)
Chloride: 99 mmol/L (ref 98–111)
Creatinine, Ser: 0.89 mg/dL (ref 0.44–1.00)
GFR, Estimated: >60 mL/min (ref >60)
Glucose, Bld: 247 mg/dL — ABNORMAL HIGH (ref 70–99)
Potassium: 4.6 mmol/L (ref 3.5–5.1)
Sodium: 135 mmol/L (ref 135–145)

## 2021-07-09 LAB — GLUCOSE, CAPILLARY
Glucose-Capillary: 266 mg/dL — ABNORMAL HIGH (ref 70–99)
Glucose-Capillary: 328 mg/dL — ABNORMAL HIGH (ref 70–99)
Glucose-Capillary: 248 mg/dL — ABNORMAL HIGH (ref 70–99)

## 2021-07-09 LAB — CBC
HCT: 31.6 % — ABNORMAL LOW (ref 36.0–46.0)
Hemoglobin: 10.7 g/dL — ABNORMAL LOW (ref 12.0–15.0)
MCH: 27.2 pg (ref 26.0–34.0)
MCHC: 33.9 g/dL (ref 30.0–36.0)
MCV: 80.4 fL (ref 80.0–100.0)
Platelets: 357 10*3/uL (ref 150–400)
RBC: 3.93 MIL/uL (ref 3.87–5.11)
RDW: 14.7 % (ref 11.5–15.5)
WBC: 28.4 10*3/uL — ABNORMAL HIGH (ref 4.0–10.5)
nRBC: 0 % (ref 0.0–0.2)

## 2021-07-09 MED ORDER — BENZONATATE 200 MG PO CAPS: 200.0000 mg | ORAL_CAPSULE | Freq: Three times a day (TID) | ORAL | 0 refills | Status: AC

## 2021-07-09 MED ORDER — ONDANSETRON HCL 4 MG PO TABS: 4.0000 mg | ORAL_TABLET | Freq: Three times a day (TID) | ORAL | 0 refills | Status: AC | PRN

## 2021-07-09 MED ORDER — DEXTROMETHORPHAN POLISTIREX ER 30 MG/5ML PO SUER: 60.0000 mg | Freq: Two times a day (BID) | ORAL | 0 refills | Status: AC

## 2021-07-09 MED ORDER — LIDOCAINE 5 % EX PTCH: 1.0000 | MEDICATED_PATCH | CUTANEOUS | 0 refills | Status: DC

## 2021-07-09 MED ORDER — CARVEDILOL 25 MG PO TABS: 25.0000 mg | ORAL_TABLET | Freq: Two times a day (BID) | ORAL | 0 refills | Status: AC

## 2021-07-09 MED ORDER — IBUPROFEN 400 MG PO TABS: 400.0000 mg | ORAL_TABLET | Freq: Four times a day (QID) | ORAL | 0 refills | Status: AC | PRN

## 2021-07-09 MED ORDER — LIDOCAINE 5 % EX PTCH
1.0000 | MEDICATED_PATCH | CUTANEOUS | Status: DC
Start: 2021-07-09 — End: 2021-07-09
  Administered 2021-07-09: 1 via TRANSDERMAL
  Filled 2021-07-09: qty 1

## 2021-07-09 MED ORDER — POLYETHYLENE GLYCOL 3350 17 G PO PACK: 17.0000 g | PACK | Freq: Every day | ORAL | 0 refills | Status: AC

## 2021-07-09 MED ORDER — PREDNISONE 20 MG PO TABS: 40.0000 mg | ORAL_TABLET | Freq: Every day | ORAL | 0 refills | Status: AC

## 2021-07-09 MED ORDER — HYDROCOD POLST-CPM POLST ER 10-8 MG/5ML PO SUER: 5.0000 mL | Freq: Two times a day (BID) | ORAL | 0 refills | Status: AC

## 2021-07-09 MED ORDER — ACETAMINOPHEN 325 MG PO TABS: 650.0000 mg | ORAL_TABLET | Freq: Four times a day (QID) | ORAL | 0 refills | Status: AC | PRN

## 2021-07-09 NOTE — TOC Transition Note (Signed)
Transition of Care [facility]) - CM/SW Discharge Note   Patient Details  Name: Mary Cuevas MRN: [id number] Date of Birth: 01-29-1961  Transition of Care [facility]) CM/SW Contact:  Kermit Balo, RN Phone Number: 07/09/2021, 12:17 PM   Clinical Narrative:    Pt is discharging to her friends home at [address] in [place], [place]. [facility] arranged for [facility] services.  Pt with orders for home oxygen. CM spoke to the patient and she has no preference on DME company. CM has arranged home oxygen with [employer]. They will deliver oxygen to the home and room for transport.  Pt states she has walker and 3 in 1 at home.  Pt states her brother will provide transport home.   Final next level of care: Home w Home Health Services Barriers to Discharge: Barriers Resolved   Patient Goals and CMS Choice Patient states their goals for this hospitalization and ongoing recovery are:: Feel better CMS Medicare.gov Compare Post Acute Care list provided to:: Patient Choice offered to / list presented to : Patient  Discharge Placement                       Discharge Plan and Services In-house Referral: Clinical Social Work Discharge Planning Services: CM Consult Post Acute Care Choice: Home Health          DME Arranged: Oxygen DME Agency: [employer] Date DME Agency Contacted: 07/09/21   Representative spoke with at DME Agency: Velna Hatchet HH Arranged: PT, OT [facility] Agency: [facility] Date [facility] Agency Contacted: 07/02/21 Time HH Agency Contacted: [phone] Representative spoke with at [facility] Agency: Kandee Keen  Social Determinants of Health (SDOH) Interventions     Readmission Risk Interventions No flowsheet data found.

## 2021-07-09 NOTE — Progress Notes (Addendum)
Physical Therapy Treatment Patient Details Name: Mary Cuevas MRN: [id number] DOB: 1960/12/07 Today's Date: 07/09/2021    History of Present Illness Pt is a 59yo female presenting to [facility] ED on 7/31 secondary to weakness and L facial droop. Likely secondary to severe HTN. Found to be +COVID-19 upon admission. Imaging revealed no acute abnormalities. Pt developed covid PNA.  PMH: HTN, DM, prior stroke, PRES, peripheral neuropathy (pt report from H&P).    PT Comments    Pt received sitting EOB, disgruntled stating "everyone's trying to make me get out of this bed. I want to go home then I can lay in my own bed if I want to." She required min guard assist transfers, and ambulation 50' with RW. Pt returned to supine at end of session.  SATURATION QUALIFICATIONS: (This note is used to comply with regulatory documentation for home oxygen)  Patient Saturations on Room Air at Rest = 92%  Patient Saturations on Room Air while Ambulating = 87%  Patient Saturations on 1 Liters of oxygen while Ambulating = 91%     Follow Up Recommendations  Home health PT;Supervision for mobility/OOB     Equipment Recommendations  None recommended by PT (Pt reports she has all needed equipment.)    Recommendations for Other Services       Precautions / Restrictions Precautions Precautions: Fall;Other (comment) Precaution Comments: covid + Restrictions Weight Bearing Restrictions: No    Mobility  Bed Mobility Overal bed mobility: Modified Independent             General bed mobility comments: +rails    Transfers Overall transfer level: Needs assistance Equipment used: Rolling walker (2 wheeled) Transfers: Sit to/from [employer] Sit to Stand: Min guard Stand pivot transfers: Min guard       General transfer comment: assist for safety  Ambulation/Gait Ambulation/Gait assistance: Min guard Gait Distance (Feet): 50 Feet Assistive device: Rolling walker (2  wheeled) Gait Pattern/deviations: Step-through pattern;Decreased stride length Gait velocity: decreased Gait velocity interpretation: <1.31 ft/sec, indicative of household ambulator General Gait Details: slow, steady gait with RW   Stairs             Wheelchair Mobility    Modified Rankin (Stroke Patients Only)       Balance Overall balance assessment: Needs assistance Sitting-balance support: No upper extremity supported;Feet supported Sitting balance-Leahy Scale: Good     Standing balance support: Bilateral upper extremity supported;No upper extremity supported;During functional activity Standing balance-Leahy Scale: Fair Standing balance comment: static stand without UE support, RW for amb                            Cognition Arousal/Alertness: Awake/alert Behavior During Therapy: Agitated;Flat affect Overall Cognitive Status: Within Functional Limits for tasks assessed                                 General Comments: Disgruntled over "everyone trying to make me get out of the bed."      Exercises      General Comments        Pertinent Vitals/Pain Pain Assessment: No/denies pain    Home Living                      Prior Function            PT Goals (current goals can now be found in  the care plan section) Acute Rehab PT Goals Patient Stated Goal: home ASAP Progress towards PT goals: Progressing toward goals    Frequency    Min 3X/week      PT Plan Equipment recommendations need to be updated    Co-evaluation              AM-PAC PT "6 Clicks" Mobility   Outcome Measure  Help needed turning from your back to your side while in a flat bed without using bedrails?: None Help needed moving from lying on your back to sitting on the side of a flat bed without using bedrails?: None Help needed moving to and from a bed to a chair (including a wheelchair)?: A Little Help needed standing up from a chair  using your arms (e.g., wheelchair or bedside chair)?: A Little Help needed to walk in hospital room?: A Little Help needed climbing 3-5 steps with a railing? : A Lot 6 Click Score: 19    End of Session Equipment Utilized During Treatment: Oxygen Activity Tolerance: Patient tolerated treatment well Patient left: in bed;with call bell/phone within reach Nurse Communication: Mobility status PT Visit Diagnosis: Muscle weakness (generalized) (M62.81);Unsteadiness on feet (R26.81)     Time: 8469-6295 PT Time Calculation (min) (ACUTE ONLY): 17 min  Charges:  $Gait Training: 8-22 mins                     Mary Cuevas, PT  Office # [phone] Pager [phone]    Mary Cuevas 07/09/2021, 12:10 PM

## 2021-07-09 NOTE — Progress Notes (Addendum)
Inpatient Diabetes Program Recommendations  AACE/ADA: New Consensus Statement on Inpatient Glycemic Control (2015)  Target Ranges:  Prepandial:   less than 140 mg/dL      Peak postprandial:   less than 180 mg/dL (1-2 hours)      Critically ill patients:  140 - 180 mg/dL   Lab Results  Component Value Date   GLUCAP 266 (H) 07/09/2021   HGBA1C 7.0 (H) 06/30/2021    Review of Glycemic Control Results for Mary Cuevas, Mary Cuevas (MRN [id number]) as of 07/09/2021 11:00  Ref. Range 07/08/2021 08:05 07/08/2021 11:58 07/08/2021 17:50 07/08/2021 20:50 07/09/2021 07:28  Glucose-Capillary Latest Ref Range: 70 - 99 mg/dL 017 (H) 510 (H) 258 (H) 233 (H) 266 (H)   Diabetes history: DM 2 Outpatient Diabetes medications: Glipizide 10 mg bid, Tradjenta 5 mg Daily Current orders for Inpatient glycemic control:  Levemir 8 units qhs Novolog 0-15 units tid   Decadron 6 mg Daily A1c 7% on 8/1  Inpatient Diabetes Program Recommendations:    -  Increase Levemir to 12 units -  Add Novolog hs scale -  Add Novolog 3 units tid meal coverage if pt eating > 50 % of meals  Thanks,  Christena Deem RN, MSN, BC-ADM Inpatient Diabetes Coordinator Team Pager [phone] (8a-5p)

## 2021-07-09 NOTE — Progress Notes (Signed)
SATURATION QUALIFICATIONS: (This note is used to comply with regulatory documentation for home oxygen)  Patient Saturations on Room Air at Rest = 94%  Patient Saturations on Room Air while Ambulating = 87%  Patient Saturations on 2 Liters of oxygen while Ambulating = 95%  Please briefly explain why patient needs home oxygen: pt dropped bellow 88% on room air

## 2021-07-09 NOTE — Progress Notes (Signed)
Mary Cuevas to be D/C'd Home per MD order.  Discussed with the patient and all questions fully answered.  VSS, Skin clean, dry and intact without evidence of skin break down, no evidence of skin tears noted. IV catheter discontinued intact. Site without signs and symptoms of complications. Dressing and pressure applied.  An After Visit Summary was printed and given to the patient. Patient received prescription.  D/c education completed with patient/family including follow up instructions, medication list, d/c activities limitations if indicated, with other d/c instructions as indicated by MD - patient able to verbalize understanding, all questions fully answered.   Patient instructed to return to ED, call 911, or call MD for any changes in condition.   Patient escorted via WC, and D/C home via private auto.  Selena Batten Jencarlo Bonadonna 07/09/2021 6:00 PM

## 2021-07-16 ENCOUNTER — Encounter: Payer: Medicare Other | Admitting: Internal Medicine

## 2021-09-23 ENCOUNTER — Other Ambulatory Visit: Payer: Self-pay | Admitting: Family Medicine

## 2021-09-23 DIAGNOSIS — Z1231 Encounter for screening mammogram for malignant neoplasm of breast: Secondary | ICD-10-CM

## 2021-10-06 ENCOUNTER — Other Ambulatory Visit: Payer: Self-pay | Admitting: Family Medicine

## 2021-10-06 DIAGNOSIS — N644 Mastodynia: Secondary | ICD-10-CM

## 2021-10-14 ENCOUNTER — Other Ambulatory Visit: Payer: Self-pay

## 2021-10-14 ENCOUNTER — Emergency Department
Admission: EM | Admit: 2021-10-14 | Discharge: 2021-10-14 | Disposition: A | Discharge status: Home / Self Care | Payer: Medicare Other | Attending: Emergency Medicine | Admitting: Emergency Medicine

## 2021-10-14 ENCOUNTER — Ambulatory Visit
Admission: RE | Admit: 2021-10-14 | Discharge: 2021-10-14 | Disposition: A | Discharge status: Home / Self Care | Payer: Medicare Other | Source: Ambulatory Visit | Attending: Family Medicine | Admitting: Family Medicine

## 2021-10-14 ENCOUNTER — Emergency Department: Payer: Medicare Other

## 2021-10-14 DIAGNOSIS — Z5321 Procedure and treatment not carried out due to patient leaving prior to being seen by health care provider: Secondary | ICD-10-CM | POA: Insufficient documentation

## 2021-10-14 DIAGNOSIS — N644 Mastodynia: Secondary | ICD-10-CM | POA: Insufficient documentation

## 2021-10-14 DIAGNOSIS — R0789 Other chest pain: Secondary | ICD-10-CM | POA: Insufficient documentation

## 2021-10-14 LAB — CBC WITH DIFFERENTIAL/PLATELET
Abs Immature Granulocytes: 0.02 10*3/uL (ref 0.00–0.07)
Basophils Absolute: 0 10*3/uL (ref 0.0–0.1)
Basophils Relative: 1 %
Eosinophils Absolute: 0.2 10*3/uL (ref 0.0–0.5)
Eosinophils Relative: 3 %
HCT: 36.7 % (ref 36.0–46.0)
Hemoglobin: 11.9 g/dL — ABNORMAL LOW (ref 12.0–15.0)
Immature Granulocytes: 0 %
Lymphocytes Relative: 32 %
Lymphs Abs: 1.9 10*3/uL (ref 0.7–4.0)
MCH: 26 pg (ref 26.0–34.0)
MCHC: 32.4 g/dL (ref 30.0–36.0)
MCV: 80.1 fL (ref 80.0–100.0)
Monocytes Absolute: 0.3 10*3/uL (ref 0.1–1.0)
Monocytes Relative: 6 %
Neutro Abs: 3.4 10*3/uL (ref 1.7–7.7)
Neutrophils Relative %: 58 %
Platelets: 365 10*3/uL (ref 150–400)
RBC: 4.58 MIL/uL (ref 3.87–5.11)
RDW: 14 % (ref 11.5–15.5)
WBC: 5.9 10*3/uL (ref 4.0–10.5)
nRBC: 0 % (ref 0.0–0.2)

## 2021-10-14 LAB — COMPREHENSIVE METABOLIC PANEL
ALT: 8 U/L (ref 0–44)
AST: 14 U/L — ABNORMAL LOW (ref 15–41)
Albumin: 4.1 g/dL (ref 3.5–5.0)
Alkaline Phosphatase: 105 U/L (ref 38–126)
Anion gap: 7 (ref 5–15)
BUN: 11 mg/dL (ref 6–20)
CO2: 29 mmol/L (ref 22–32)
Calcium: 9.3 mg/dL (ref 8.9–10.3)
Chloride: 105 mmol/L (ref 98–111)
Creatinine, Ser: 0.67 mg/dL (ref 0.44–1.00)
GFR, Estimated: >60 mL/min (ref >60)
Glucose, Bld: 131 mg/dL — ABNORMAL HIGH (ref 70–99)
Potassium: 3.3 mmol/L — ABNORMAL LOW (ref 3.5–5.1)
Sodium: 141 mmol/L (ref 135–145)
Total Bilirubin: 0.8 mg/dL (ref 0.3–1.2)
Total Protein: 9 g/dL — ABNORMAL HIGH (ref 6.5–8.1)

## 2021-10-14 MED ORDER — IOHEXOL 300 MG/ML  SOLN
75.0000 mL | Freq: Once | INTRAMUSCULAR | Status: AC | PRN
  Administered 2021-10-14: 75 mL via INTRAVENOUS

## 2021-10-14 NOTE — ED Triage Notes (Signed)
Pt states sent here from [place] for CT scan. States lump to right chest that is painful with palpation started a couple weeks ago.  MSE jackie in triage

## 2021-10-14 NOTE — ED Provider Notes (Signed)
Emergency Medicine Provider Triage Evaluation Note  Mary Cuevas , a 60 y.o. female  was evaluated in triage.  Pt complains of presents to the emergency department referred by [facility] breast center for chest CT.  Patient has had prior mammogram and breast ultrasound with no contributory findings.  Patient has concern for a 4 cm x 3 cm right-sided chest wall mass that has become progressively more noticeable over the past 2 to 3 weeks.  Patient states the area is tender to the touch and immobile.  She denies weight loss or weight gain.  No fever or chills.  Review of Systems  Positive: Patient has chest wall pain/mass Negative: No chest tightness or SOB.   Physical Exam  BP (!) 156/92 (BP Location: Left Arm)   Pulse 96   Temp 98.6 F (37 C) (Oral)   Resp 18   Ht 5\' 3"  (1.6 m)   Wt 69.9 kg   SpO2 97%   BMI 27.28 kg/m  Gen:   Awake, no distress   Resp:  Normal effort  MSK:   Moves extremities without difficulty  Other:    Medical Decision Making  Medically screening exam initiated at 4:52 PM.  Appropriate orders placed.  Mary Cuevas was informed that the remainder of the evaluation will be completed by another provider, this initial triage assessment does not replace that evaluation, and the importance of remaining in the ED until their evaluation is complete.     Mary Conroy Dixie, PA-C 10/14/21 1653    10/16/21, MD 10/14/21 2122

## 2021-10-16 ENCOUNTER — Other Ambulatory Visit: Payer: Medicare Other

## 2021-10-16 ENCOUNTER — Telehealth: Payer: Self-pay | Admitting: Emergency Medicine

## 2021-10-16 NOTE — Telephone Encounter (Signed)
Called patient due to left emergency department before provider exam to inquire about condition and follow up plans. Left message. 

## 2022-03-10 ENCOUNTER — Telehealth: Payer: Self-pay

## 2022-03-10 NOTE — Telephone Encounter (Signed)
CALLED PATIENT NO ANSWER LEFT VOICEMAIL FOR A CALL BACK ? ?

## 2022-03-11 ENCOUNTER — Telehealth: Payer: Self-pay

## 2022-03-11 NOTE — Telephone Encounter (Signed)
Pt returned call stating that shes wants to hold off until June to schedule an colonoscpy ?

## 2022-03-11 NOTE — Telephone Encounter (Signed)
CALLED PATIENT NO ANSWER LEFT VOICEMAIL FOR A CALL BACK °Letter sent °

## 2022-03-13 ENCOUNTER — Telehealth: Payer: Self-pay

## 2022-03-13 NOTE — Telephone Encounter (Signed)
Patient will call us in June to schedule procedure ?

## 2022-12-21 ENCOUNTER — Other Ambulatory Visit: Payer: Self-pay | Admitting: Family Medicine

## 2022-12-21 DIAGNOSIS — E041 Nontoxic single thyroid nodule: Secondary | ICD-10-CM

## 2022-12-21 DIAGNOSIS — Z1231 Encounter for screening mammogram for malignant neoplasm of breast: Secondary | ICD-10-CM

## 2022-12-29 ENCOUNTER — Ambulatory Visit
Admission: RE | Admit: 2022-12-29 | Discharge: 2022-12-29 | Disposition: A | Discharge status: Home / Self Care | Payer: Medicare Other | Source: Ambulatory Visit | Attending: Family Medicine | Admitting: Family Medicine

## 2022-12-29 DIAGNOSIS — E041 Nontoxic single thyroid nodule: Secondary | ICD-10-CM | POA: Diagnosis not present

## 2023-02-22 ENCOUNTER — Ambulatory Visit
Admission: RE | Admit: 2023-02-22 | Discharge: 2023-02-22 | Disposition: A | Discharge status: Home / Self Care | Payer: Medicare Other | Source: Ambulatory Visit | Attending: Family Medicine | Admitting: Family Medicine

## 2023-02-22 DIAGNOSIS — Z1231 Encounter for screening mammogram for malignant neoplasm of breast: Secondary | ICD-10-CM | POA: Diagnosis not present

## 2023-03-15 ENCOUNTER — Emergency Department
Admission: EM | Admit: 2023-03-15 | Discharge: 2023-03-15 | Disposition: A | Discharge status: Home / Self Care | Payer: Medicare Other | Attending: Emergency Medicine | Admitting: Emergency Medicine

## 2023-03-15 ENCOUNTER — Other Ambulatory Visit: Payer: Self-pay

## 2023-03-15 ENCOUNTER — Emergency Department: Payer: Medicare Other

## 2023-03-15 DIAGNOSIS — I10 Essential (primary) hypertension: Secondary | ICD-10-CM | POA: Diagnosis not present

## 2023-03-15 DIAGNOSIS — U071 COVID-19: Secondary | ICD-10-CM

## 2023-03-15 DIAGNOSIS — E119 Type 2 diabetes mellitus without complications: Secondary | ICD-10-CM | POA: Insufficient documentation

## 2023-03-15 DIAGNOSIS — R0602 Shortness of breath: Secondary | ICD-10-CM

## 2023-03-15 DIAGNOSIS — R531 Weakness: Secondary | ICD-10-CM

## 2023-03-15 LAB — CBC
HCT: 34.8 % — ABNORMAL LOW (ref 36.0–46.0)
Hemoglobin: 11.5 g/dL — ABNORMAL LOW (ref 12.0–15.0)
MCH: 26.9 pg (ref 26.0–34.0)
MCHC: 33 g/dL (ref 30.0–36.0)
MCV: 81.3 fL (ref 80.0–100.0)
Platelets: 219 10*3/uL (ref 150–400)
RBC: 4.28 MIL/uL (ref 3.87–5.11)
RDW: 13.6 % (ref 11.5–15.5)
WBC: 4.9 10*3/uL (ref 4.0–10.5)
nRBC: 0 % (ref 0.0–0.2)

## 2023-03-15 LAB — BASIC METABOLIC PANEL
Anion gap: 11 (ref 5–15)
BUN: 21 mg/dL — ABNORMAL HIGH (ref 6–20)
CO2: 23 mmol/L (ref 22–32)
Calcium: 9 mg/dL (ref 8.9–10.3)
Chloride: 106 mmol/L (ref 98–111)
Creatinine, Ser: 1.56 mg/dL — ABNORMAL HIGH (ref 0.44–1.00)
GFR, Estimated: 38 mL/min — ABNORMAL LOW (ref >60)
Glucose, Bld: 147 mg/dL — ABNORMAL HIGH (ref 70–99)
Potassium: 3.5 mmol/L (ref 3.5–5.1)
Sodium: 140 mmol/L (ref 135–145)

## 2023-03-15 LAB — SARS CORONAVIRUS 2 BY RT PCR: SARS Coronavirus 2 by RT PCR: POSITIVE — AB

## 2023-03-15 LAB — TROPONIN I (HIGH SENSITIVITY): Troponin I (High Sensitivity): 6 ng/L (ref <18)

## 2023-03-15 MED ORDER — ONDANSETRON 8 MG PO TBDP: 8.0000 mg | ORAL_TABLET | Freq: Three times a day (TID) | ORAL | 0 refills | Status: DC | PRN

## 2023-03-15 NOTE — ED Notes (Signed)
Pt Dc to home. Dc instructions reviewed with all questions answered. Pt voices understanding. Pt ambulatory out of dept with steady gait 

## 2023-03-15 NOTE — ED Triage Notes (Signed)
Pt presents to ER with c/o sob and HA that started yesterday but have been getting worse today.  Pt states sob is worse with exertion.  Denies hx of COPD, CHF or asthma.  Denies any new swelling or weight gain.  Denies any new sick contacts, or fevers at home.  Pt is otherwise A&O x4 and in NAD in triage.

## 2023-03-15 NOTE — ED Provider Notes (Signed)
[facility] Provider Note   Event Date/Time   First MD Initiated Contact with Patient 03/15/23 2059     (approximate) History  Shortness of Breath  HPI Mary Cuevas is a 62 y.o. female with a past medical history of type 2 diabetes and hypertension who presents complaining of shortness of breath and headache that began 2 days prior to arrival and has been getting worse over the last 48 hours.  Patient states that she has been having shortness of breath with exertion as well as shin generalized weakness.  Patient denies any COPD or smoking history.  Patient denies any sick contacts or recent travel. ROS: Patient currently denies any vision changes, tinnitus, difficulty speaking, facial droop, sore throat, chest pain, abdominal pain, nausea/vomiting/diarrhea, dysuria, or weakness/numbness/paresthesias in any extremity   Physical Exam  Triage Vital Signs: ED Triage Vitals [03/15/23 1952]  Enc Vitals Group     BP 119/70     Pulse Rate 93     Resp (!) 24     Temp 97.6 F (36.4 C)     Temp Source Oral     SpO2 98 %     Weight 152 lb (68.9 kg)     Height 5\' 3"  (1.6 m)     Head Circumference      Peak Flow      Pain Score 7     Pain Loc      Pain Edu?      Excl. in GC?    Most recent vital signs: Vitals:   03/15/23 1952 03/15/23 2208  BP: 119/70 (!) 112/53  Pulse: 93 76  Resp: (!) 24 16  Temp: 97.6 F (36.4 C) 98.2 F (36.8 C)  SpO2: 98% 97%   General: Awake, oriented x4. CV:  Good peripheral perfusion.  Resp:  Normal effort.  Abd:  No distention.  Other:  Middle-aged African-American female laying in bed in no acute distress ED Results / Procedures / Treatments  Labs (all labs ordered are listed, but only abnormal results are displayed) Labs Reviewed  SARS CORONAVIRUS 2 BY RT PCR - Abnormal; Notable for the following components:      Result Value   SARS Coronavirus 2 by RT PCR POSITIVE (*)    All other components within normal limits   CBC - Abnormal; Notable for the following components:   Hemoglobin 11.5 (*)    HCT 34.8 (*)    All other components within normal limits  BASIC METABOLIC PANEL - Abnormal; Notable for the following components:   Glucose, Bld 147 (*)    BUN 21 (*)    Creatinine, Ser 1.56 (*)    GFR, Estimated 38 (*)    All other components within normal limits  TROPONIN I (HIGH SENSITIVITY)  TROPONIN I (HIGH SENSITIVITY)   EKG ED ECG REPORT I, Merwyn Katos, the attending physician, personally viewed and interpreted this ECG. Date: 03/15/2023 EKG Time: 1958 Rate: 91 Rhythm: normal sinus rhythm QRS Axis: normal Intervals: normal ST/T Wave abnormalities: normal Narrative Interpretation: no evidence of acute ischemia RADIOLOGY ED MD interpretation: Single view portable x-ray interpreted independently by me shows no acute cardiopulmonary disease -Agree with radiology assessment Official radiology report(s): DG Chest 1 View  Result Date: 03/15/2023 CLINICAL DATA:  Shortness of breath EXAM: CHEST  1 VIEW COMPARISON:  X-ray 07/02/2021.  Chest CT 10/14/2021 FINDINGS: No pneumothorax, effusion or edema. Borderline cardiopericardial silhouette. No consolidation. The chest wall mass seen by prior CT scan is  not well appreciated by x-ray. Please correlate with clinical history. IMPRESSION: No acute cardiopulmonary disease. The previous chest wall mass on the right side is not well appreciated by x-ray. Please correlate with clinical history Electronically Signed   By: Karen Kays M.D.   On: 03/15/2023 20:29   PROCEDURES: Critical Care performed: No .1-3 Lead EKG Interpretation  Performed by: Merwyn Katos, MD Authorized by: Merwyn Katos, MD     Interpretation: normal     ECG rate:  71   ECG rate assessment: normal     Rhythm: sinus rhythm     Ectopy: none     Conduction: normal    MEDICATIONS ORDERED IN ED: Medications - No data to display IMPRESSION / MDM / ASSESSMENT AND PLAN / ED COURSE   I reviewed the triage vital signs and the nursing notes.                             The patient is on the cardiac monitor to evaluate for evidence of arrhythmia and/or significant heart rate changes. Patient's presentation is most consistent with acute presentation with potential threat to life or bodily function. Presentation most consistent with Viral Syndrome.  Patient has tested positive for COVID-19. Based on vitals and exam they are nontoxic and stable for discharge.  Given History and Exam I have a lower suspicion for: Emergent CardioPulmonary causes [such as Acute Asthma or COPD Exacerbation, acute Heart Failure or exacerbation, PE, PTX, atypical ACS, PNA]. Emergent Otolaryngeal causes [such as PTA, RPA, Ludwigs, Epiglottitis, EBV].  Regarding Emergent Travel or Immunosuppressive related infectious: I have a low suspicion for acute HIV.  Will provide strict return precautions and instructions on self-isolation/quarantine and anticipatory guidance.  Dispo: discharge home   FINAL CLINICAL IMPRESSION(S) / ED DIAGNOSES   Final diagnoses:  COVID-19 virus infection  SOB (shortness of breath)  Generalized weakness   Rx / DC Orders   ED Discharge Orders          Ordered    ondansetron (ZOFRAN-ODT) 8 MG disintegrating tablet  Every 8 hours PRN        03/15/23 2131           Note:  This document was prepared using Dragon voice recognition software and may include unintentional dictation errors.   Merwyn Katos, MD 03/16/23 [phone]

## 2023-04-16 ENCOUNTER — Other Ambulatory Visit: Payer: Self-pay | Admitting: Otolaryngology

## 2023-04-16 DIAGNOSIS — E041 Nontoxic single thyroid nodule: Secondary | ICD-10-CM

## 2023-04-20 ENCOUNTER — Ambulatory Visit: Payer: Medicare Other

## 2023-04-21 ENCOUNTER — Ambulatory Visit
Admission: RE | Admit: 2023-04-21 | Discharge: 2023-04-21 | Disposition: A | Discharge status: Home / Self Care | Payer: Medicare Other | Source: Ambulatory Visit | Attending: Otolaryngology | Admitting: Otolaryngology

## 2023-04-21 DIAGNOSIS — E041 Nontoxic single thyroid nodule: Secondary | ICD-10-CM | POA: Insufficient documentation

## 2023-04-21 MED ORDER — LIDOCAINE HCL (PF) 1 % IJ SOLN
10.0000 mL | Freq: Once | INTRAMUSCULAR | Status: AC
  Administered 2023-04-21: 10 mL via INTRADERMAL
  Filled 2023-04-21: qty 10

## 2023-04-21 NOTE — Procedures (Signed)
Successful US guided FNA of left mid thyroid nodule No complications. See PACS for full report.    Alex Gardener, AGNP-BC 04/21/2023, 3:03 PM

## 2023-04-23 LAB — CYTOLOGY - NON PAP

## 2023-10-02 IMAGING — MG DIGITAL DIAGNOSTIC BILAT W/ TOMO W/ CAD
6 of 10 series · 6 of 30 positions shown · non-contrast
Comparison: Previous exam(s).

CLINICAL DATA: 60-year-old female with a painful, rapidly enlarging
right chest wall mass for 3-4 weeks. The patient is having
significant, associated pain which is radiating to the breast and
back.

EXAM:
DIGITAL DIAGNOSTIC BILATERAL MAMMOGRAM WITH TOMOSYNTHESIS AND CAD;
ULTRASOUND RIGHT BREAST LIMITED
TECHNIQUE: Bilateral digital diagnostic mammography and breast tomosynthesis
was performed. The images were evaluated with computer-aided
detection.; Targeted ultrasound examination of the right breast was
performed

[L MLO synth-2D]
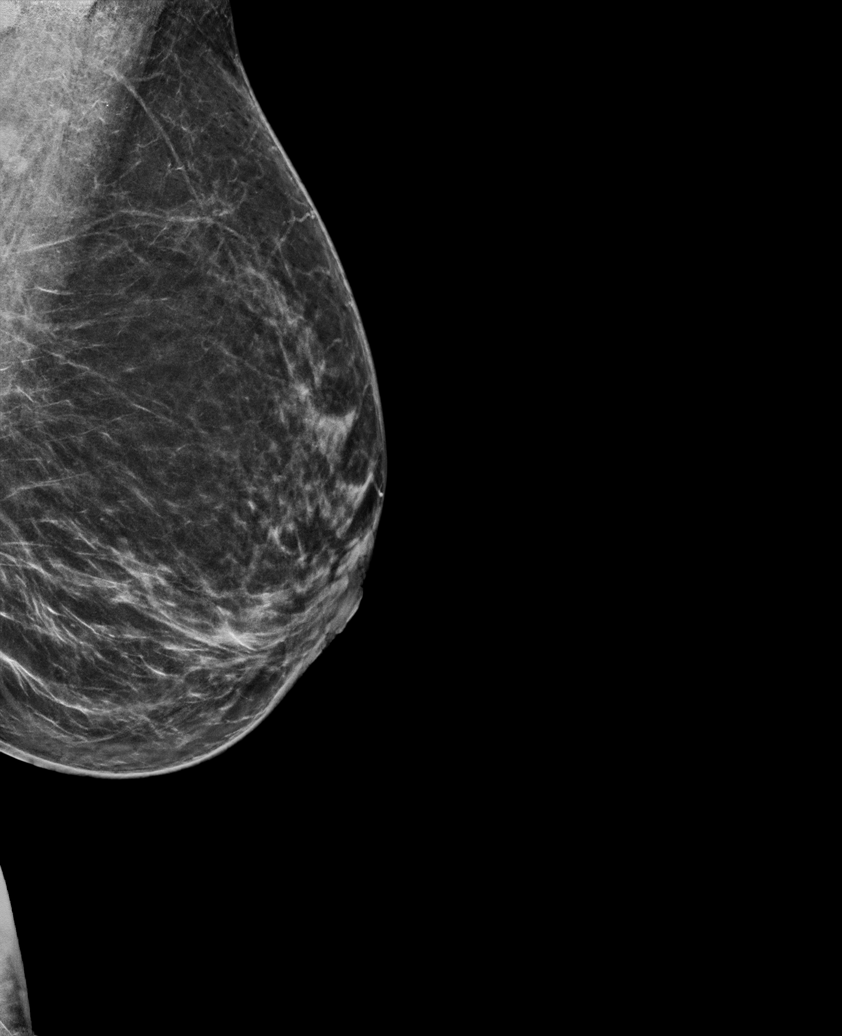

[R CC synth-2D]
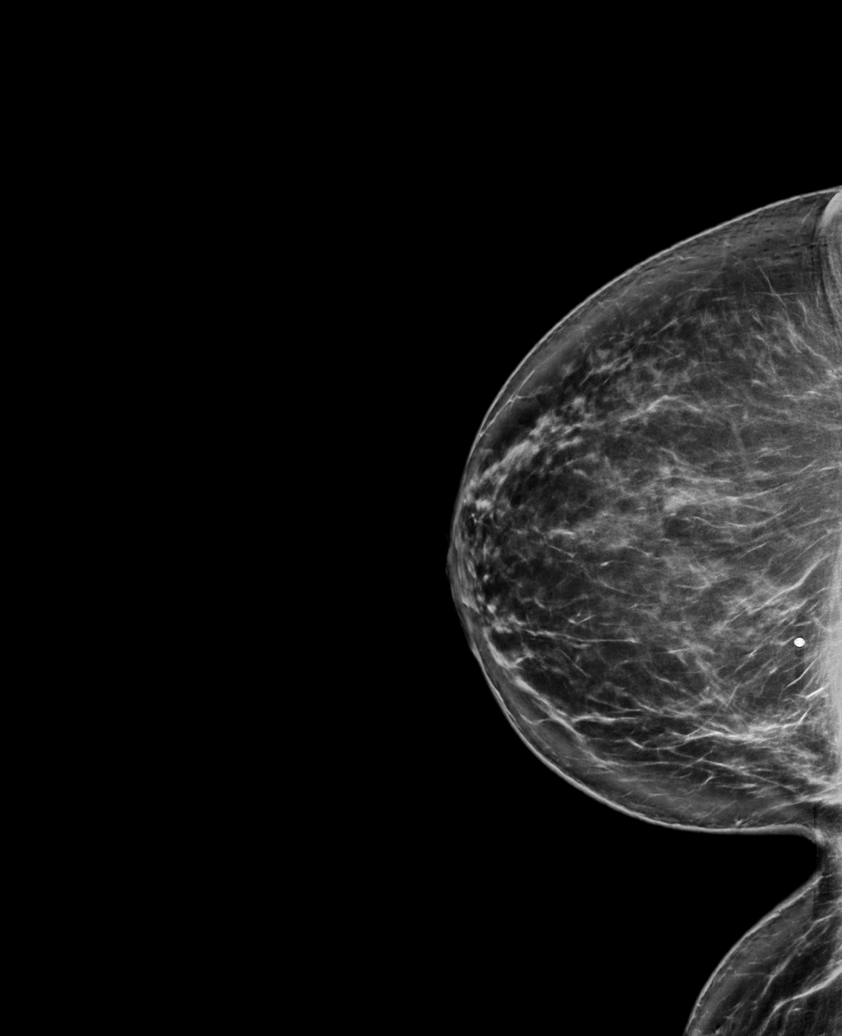

[R SIO synth-2D]
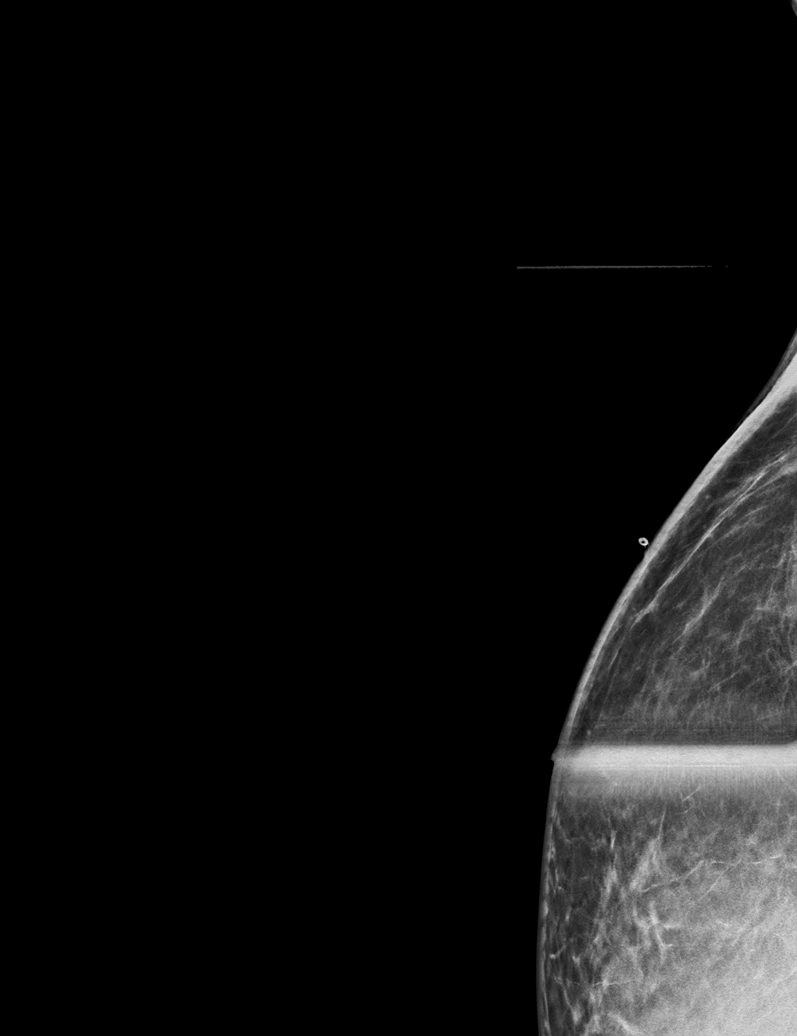

[L CC synth-2D]
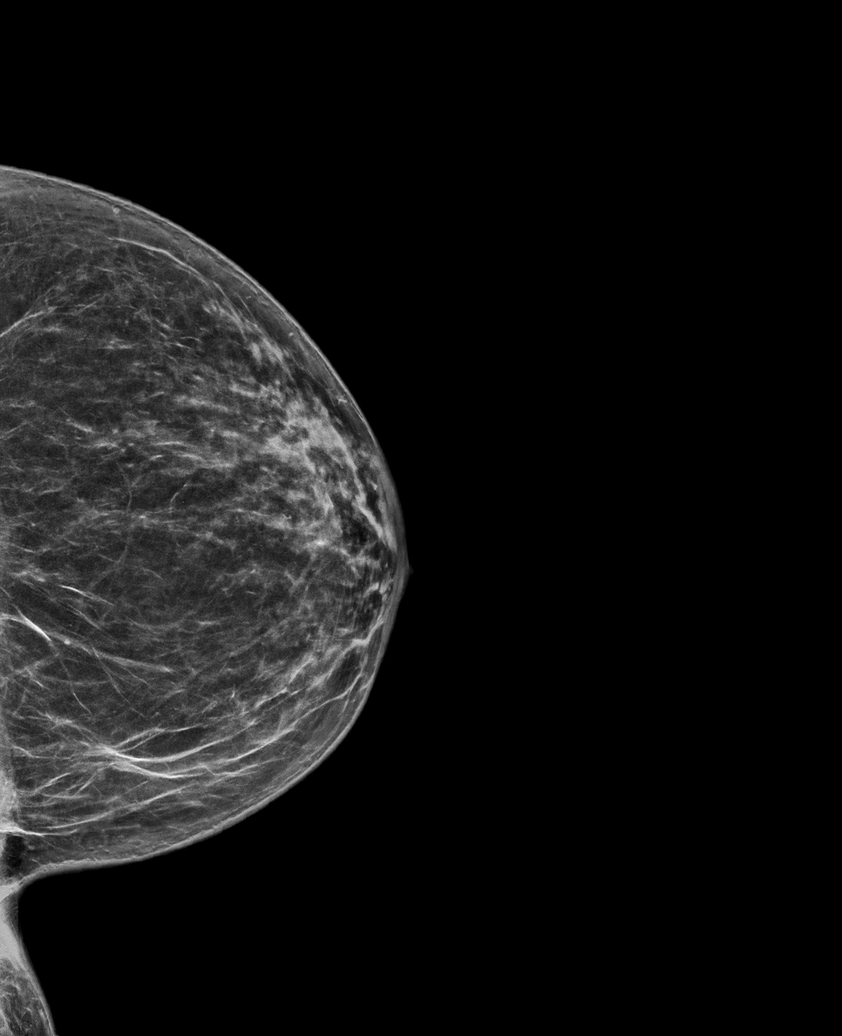

[R MLO synth-2D]
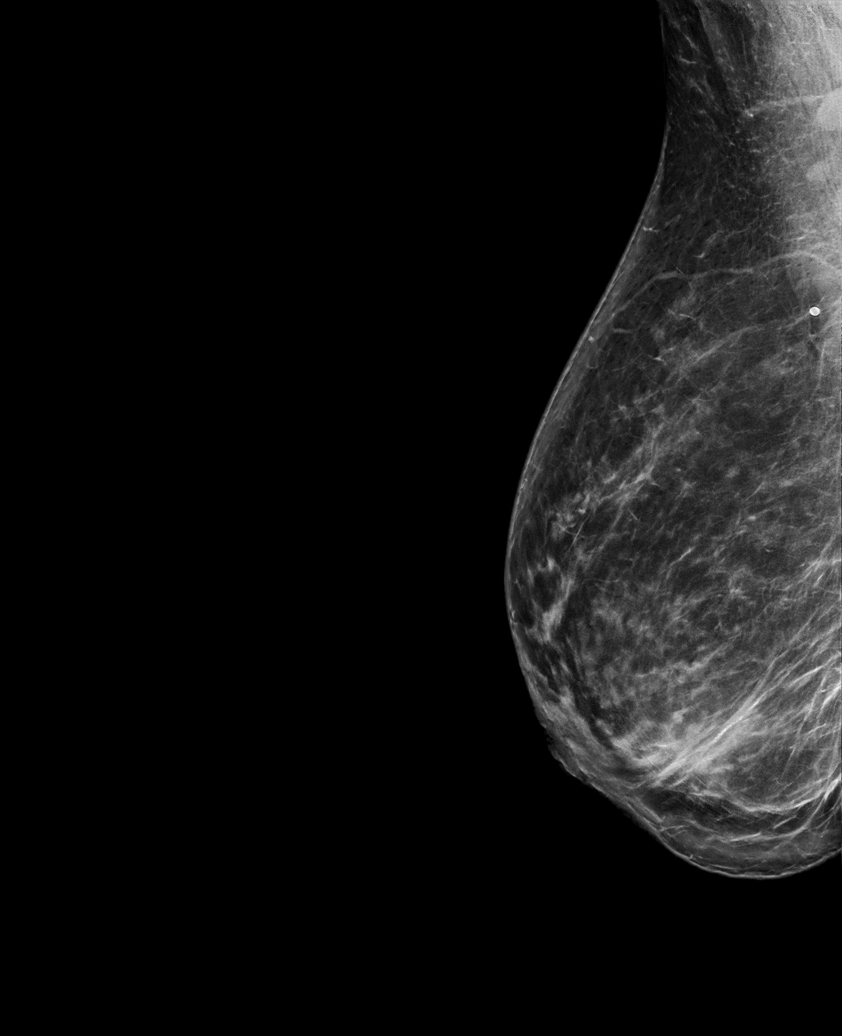

[R CC tomo · tomo slice 41/81.0]
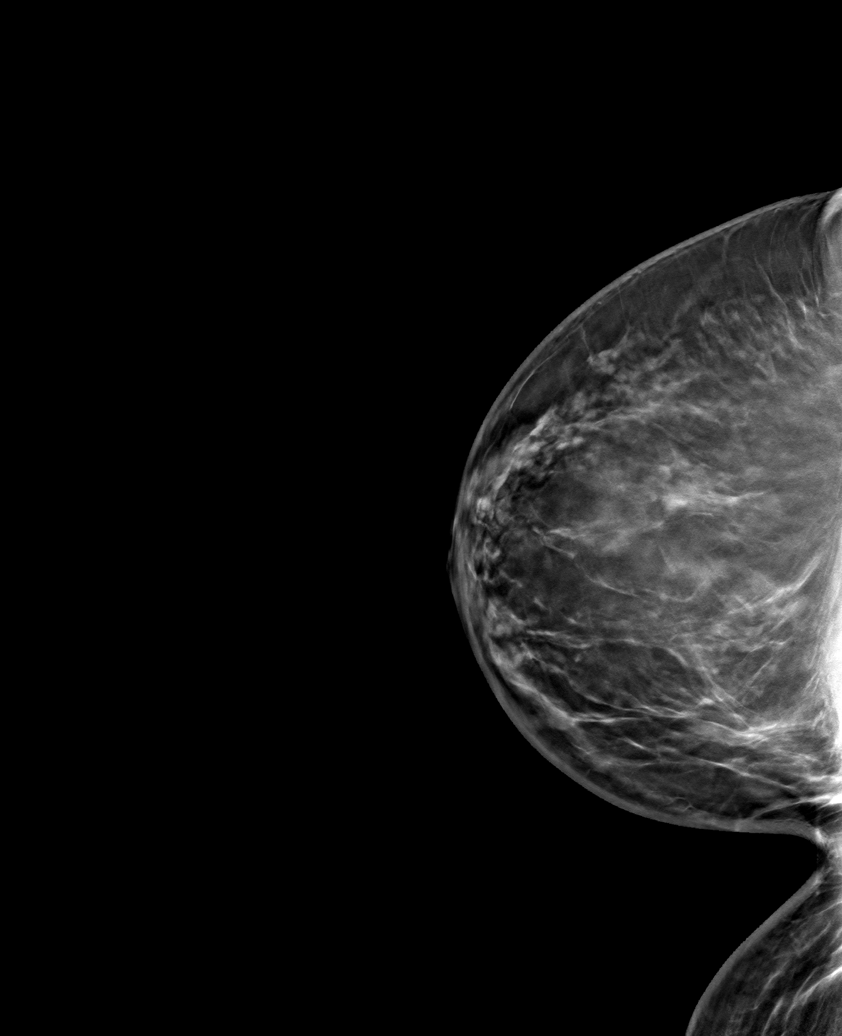

[6 of 30 positions shown; findings below may reference images not displayed]

ACR Breast Density Category b: There are scattered areas of
fibroglandular density.
FINDINGS: No focal or suspicious mammographic findings are identified in
either breast.

On physical exam, there is a large, firm fixed lump involving the
entire upper inner quadrant of the right chest wall superior to the
breast tissue.

Targeted ultrasound is performed, showing an ill-defined mass deep
to the pectoralis muscle along the right chest wall at the 1 o'clock
position 10 cm from the nipple. This corresponds with the patient's
palpable lump and site of significant pain. There is marked
surrounding edema of the soft tissue. Due to the size deep location
of the mass, evaluation is limited with ultrasound. It measures at
least 6-7 cm.
IMPRESSION: 1. Incomplete evaluation of a rapidly enlarging upper right chest
wall mass deep to the pectoralis muscle. This may be originating
from within or between the ribs.
2. No evidence of malignancy within either breast on today's
mammographic evaluation.

RECOMMENDATION:
Recommendation is for further evaluation of the chest wall mass with
contrast enhanced chest CT. Given the rapidly progressive nature of
the mass and significant pain today, urgent evaluation and workup is
recommended. The findings were discussed with ordering provider Dr.
Just at the time of this interpretation. The patient was instructed
to proceed directly to the emergency department for further
evaluation and workup. She confirmed understanding of this plan and
was escorted to the emergency department.

I have discussed the findings and recommendations with the patient.
If applicable, a reminder letter will be sent to the patient
regarding the next appointment.

BI-RADS CATEGORY  1: Negative for breast cancer.

## 2024-02-01 ENCOUNTER — Other Ambulatory Visit: Payer: Self-pay | Admitting: Family Medicine

## 2024-02-01 DIAGNOSIS — Z1231 Encounter for screening mammogram for malignant neoplasm of breast: Secondary | ICD-10-CM

## 2024-02-23 ENCOUNTER — Ambulatory Visit
Admission: RE | Admit: 2024-02-23 | Discharge: 2024-02-23 | Disposition: A | Discharge status: Home / Self Care | Source: Ambulatory Visit | Attending: Family Medicine | Admitting: Family Medicine

## 2024-02-23 DIAGNOSIS — Z1231 Encounter for screening mammogram for malignant neoplasm of breast: Secondary | ICD-10-CM | POA: Insufficient documentation

## 2024-05-24 ENCOUNTER — Other Ambulatory Visit: Payer: Self-pay | Admitting: Family Medicine

## 2024-05-24 ENCOUNTER — Ambulatory Visit
Admission: RE | Admit: 2024-05-24 | Discharge: 2024-05-24 | Disposition: A | Discharge status: Home / Self Care | Source: Ambulatory Visit | Attending: Family Medicine | Admitting: Family Medicine

## 2024-05-24 ENCOUNTER — Ambulatory Visit
Admission: RE | Admit: 2024-05-24 | Discharge: 2024-05-24 | Disposition: A | Discharge status: Home / Self Care | Attending: Family Medicine | Admitting: Family Medicine

## 2024-05-24 DIAGNOSIS — R059 Cough, unspecified: Secondary | ICD-10-CM | POA: Insufficient documentation

## 2024-06-21 ENCOUNTER — Encounter: Payer: Self-pay | Admitting: Family Medicine

## 2024-06-23 ENCOUNTER — Other Ambulatory Visit
Admission: RE | Admit: 2024-06-23 | Discharge: 2024-06-23 | Disposition: A | Discharge status: Home / Self Care | Source: Ambulatory Visit | Attending: Ophthalmology | Admitting: Ophthalmology

## 2024-06-23 DIAGNOSIS — R519 Headache, unspecified: Secondary | ICD-10-CM | POA: Diagnosis present

## 2024-06-23 LAB — CBC WITH DIFFERENTIAL/PLATELET
Abs Immature Granulocytes: 0.01 K/uL (ref 0.00–0.07)
Basophils Absolute: 0 K/uL (ref 0.0–0.1)
Basophils Relative: 0 %
Eosinophils Absolute: 0.1 K/uL (ref 0.0–0.5)
Eosinophils Relative: 2 %
HCT: 36.1 % (ref 36.0–46.0)
Hemoglobin: 12.3 g/dL (ref 12.0–15.0)
Immature Granulocytes: 0 %
Lymphocytes Relative: 31 %
Lymphs Abs: 1.6 K/uL (ref 0.7–4.0)
MCH: 27.2 pg (ref 26.0–34.0)
MCHC: 34.1 g/dL (ref 30.0–36.0)
MCV: 79.9 fL — ABNORMAL LOW (ref 80.0–100.0)
Monocytes Absolute: 0.3 K/uL (ref 0.1–1.0)
Monocytes Relative: 6 %
Neutro Abs: 3.1 K/uL (ref 1.7–7.7)
Neutrophils Relative %: 61 %
Platelets: 238 K/uL (ref 150–400)
RBC: 4.52 MIL/uL (ref 3.87–5.11)
RDW: 14.1 % (ref 11.5–15.5)
WBC: 5.1 K/uL (ref 4.0–10.5)
nRBC: 0 % (ref 0.0–0.2)

## 2024-06-23 LAB — SEDIMENTATION RATE: Sed Rate: 40 mm/h — ABNORMAL HIGH (ref 0–30)

## 2024-06-23 LAB — C-REACTIVE PROTEIN: CRP: 0.6 mg/dL (ref <1.0)

## 2024-06-29 ENCOUNTER — Other Ambulatory Visit: Payer: Self-pay | Admitting: Family Medicine

## 2024-06-29 DIAGNOSIS — R519 Headache, unspecified: Secondary | ICD-10-CM

## 2024-07-04 ENCOUNTER — Ambulatory Visit
Admission: RE | Admit: 2024-07-04 | Discharge: 2024-07-04 | Disposition: A | Discharge status: Home / Self Care | Source: Ambulatory Visit | Attending: Family Medicine | Admitting: Family Medicine

## 2024-07-04 DIAGNOSIS — R519 Headache, unspecified: Secondary | ICD-10-CM | POA: Insufficient documentation

## 2024-07-13 ENCOUNTER — Other Ambulatory Visit: Payer: Self-pay | Admitting: Family Medicine

## 2024-07-13 DIAGNOSIS — M316 Other giant cell arteritis: Secondary | ICD-10-CM

## 2024-07-17 ENCOUNTER — Ambulatory Visit: Admission: RE | Admit: 2024-07-17 | Source: Ambulatory Visit

## 2024-07-26 ENCOUNTER — Ambulatory Visit
Admission: RE | Admit: 2024-07-26 | Discharge: 2024-07-26 | Disposition: A | Discharge status: Home / Self Care | Source: Ambulatory Visit | Attending: Family Medicine | Admitting: Family Medicine

## 2024-07-26 DIAGNOSIS — R9389 Abnormal findings on diagnostic imaging of other specified body structures: Secondary | ICD-10-CM | POA: Diagnosis not present

## 2024-07-26 DIAGNOSIS — M316 Other giant cell arteritis: Secondary | ICD-10-CM | POA: Diagnosis present

## 2024-07-26 MED ORDER — GADOBUTROL 1 MMOL/ML IV SOLN
7.0000 mL | Freq: Once | INTRAVENOUS | Status: AC | PRN
  Administered 2024-07-26: 7 mL via INTRAVENOUS

## 2024-08-10 ENCOUNTER — Other Ambulatory Visit: Payer: Self-pay

## 2024-08-10 ENCOUNTER — Encounter: Payer: Self-pay | Admitting: Emergency Medicine

## 2024-08-10 ENCOUNTER — Emergency Department

## 2024-08-10 ENCOUNTER — Inpatient Hospital Stay

## 2024-08-10 ENCOUNTER — Inpatient Hospital Stay
Admission: EM | Admit: 2024-08-10 | Discharge: 2024-08-14 | DRG: 545 | Disposition: A | Discharge status: Home / Self Care | Attending: Internal Medicine | Admitting: Internal Medicine

## 2024-08-10 DIAGNOSIS — Z791 Long term (current) use of non-steroidal anti-inflammatories (NSAID): Secondary | ICD-10-CM

## 2024-08-10 DIAGNOSIS — M549 Dorsalgia, unspecified: Secondary | ICD-10-CM | POA: Diagnosis present

## 2024-08-10 DIAGNOSIS — Z90710 Acquired absence of both cervix and uterus: Secondary | ICD-10-CM

## 2024-08-10 DIAGNOSIS — H547 Unspecified visual loss: Secondary | ICD-10-CM | POA: Diagnosis present

## 2024-08-10 DIAGNOSIS — R011 Cardiac murmur, unspecified: Secondary | ICD-10-CM | POA: Diagnosis present

## 2024-08-10 DIAGNOSIS — Z6826 Body mass index (BMI) 26.0-26.9, adult: Secondary | ICD-10-CM

## 2024-08-10 DIAGNOSIS — R0609 Other forms of dyspnea: Secondary | ICD-10-CM | POA: Diagnosis present

## 2024-08-10 DIAGNOSIS — E1165 Type 2 diabetes mellitus with hyperglycemia: Secondary | ICD-10-CM | POA: Diagnosis present

## 2024-08-10 DIAGNOSIS — Z7952 Long term (current) use of systemic steroids: Secondary | ICD-10-CM

## 2024-08-10 DIAGNOSIS — Z8673 Personal history of transient ischemic attack (TIA), and cerebral infarction without residual deficits: Secondary | ICD-10-CM | POA: Diagnosis not present

## 2024-08-10 DIAGNOSIS — Z885 Allergy status to narcotic agent status: Secondary | ICD-10-CM | POA: Diagnosis not present

## 2024-08-10 DIAGNOSIS — I10 Essential (primary) hypertension: Secondary | ICD-10-CM | POA: Diagnosis present

## 2024-08-10 DIAGNOSIS — Z79899 Other long term (current) drug therapy: Secondary | ICD-10-CM

## 2024-08-10 DIAGNOSIS — E119 Type 2 diabetes mellitus without complications: Secondary | ICD-10-CM

## 2024-08-10 DIAGNOSIS — J180 Bronchopneumonia, unspecified organism: Secondary | ICD-10-CM | POA: Diagnosis present

## 2024-08-10 DIAGNOSIS — R0602 Shortness of breath: Secondary | ICD-10-CM | POA: Diagnosis present

## 2024-08-10 DIAGNOSIS — Z7982 Long term (current) use of aspirin: Secondary | ICD-10-CM

## 2024-08-10 DIAGNOSIS — M316 Other giant cell arteritis: Secondary | ICD-10-CM | POA: Diagnosis not present

## 2024-08-10 DIAGNOSIS — E663 Overweight: Secondary | ICD-10-CM | POA: Diagnosis present

## 2024-08-10 DIAGNOSIS — Z7984 Long term (current) use of oral hypoglycemic drugs: Secondary | ICD-10-CM

## 2024-08-10 LAB — PROTIME-INR
INR: 1 (ref 0.8–1.2)
Prothrombin Time: 14.1 s (ref 11.4–15.2)

## 2024-08-10 LAB — BASIC METABOLIC PANEL WITH GFR
Anion gap: 16 — ABNORMAL HIGH (ref 5–15)
BUN: 15 mg/dL (ref 6–20)
CO2: 20 mmol/L — ABNORMAL LOW (ref 22–32)
Calcium: 9.8 mg/dL (ref 8.9–10.3)
Chloride: 104 mmol/L (ref 98–111)
Creatinine, Ser: 0.68 mg/dL (ref 0.44–1.00)
GFR, Estimated: >60 mL/min (ref >60)
Glucose, Bld: 235 mg/dL — ABNORMAL HIGH (ref 70–99)
Potassium: 3.9 mmol/L (ref 3.5–5.1)
Sodium: 140 mmol/L (ref 135–145)

## 2024-08-10 LAB — GLUCOSE, CAPILLARY: Glucose-Capillary: 193 mg/dL — ABNORMAL HIGH (ref 70–99)

## 2024-08-10 LAB — CBC
HCT: 38.8 % (ref 36.0–46.0)
Hemoglobin: 12.9 g/dL (ref 12.0–15.0)
MCH: 27.2 pg (ref 26.0–34.0)
MCHC: 33.2 g/dL (ref 30.0–36.0)
MCV: 81.9 fL (ref 80.0–100.0)
Platelets: 275 K/uL (ref 150–400)
RBC: 4.74 MIL/uL (ref 3.87–5.11)
RDW: 13.6 % (ref 11.5–15.5)
WBC: 6.3 K/uL (ref 4.0–10.5)
nRBC: 0 % (ref 0.0–0.2)

## 2024-08-10 MED ORDER — GABAPENTIN 300 MG PO CAPS
300.0000 mg | ORAL_CAPSULE | Freq: Three times a day (TID) | ORAL | Status: DC
Start: 2024-08-10 — End: 2024-08-11
  Filled 2024-08-10 (×2): qty 1

## 2024-08-10 MED ORDER — AMLODIPINE BESYLATE 10 MG PO TABS
10.0000 mg | ORAL_TABLET | Freq: Every day | ORAL | Status: DC
  Administered 2024-08-11 – 2024-08-14 (×4): 10 mg via ORAL
  Filled 2024-08-10 (×4): qty 1

## 2024-08-10 MED ORDER — ROSUVASTATIN CALCIUM 10 MG PO TABS
40.0000 mg | ORAL_TABLET | Freq: Every day | ORAL | Status: DC
  Administered 2024-08-10 – 2024-08-13 (×4): 40 mg via ORAL
  Filled 2024-08-10: qty 2
  Filled 2024-08-10 (×4): qty 4

## 2024-08-10 MED ORDER — LINAGLIPTIN 5 MG PO TABS
5.0000 mg | ORAL_TABLET | Freq: Every day | ORAL | Status: DC
  Administered 2024-08-11 – 2024-08-14 (×4): 5 mg via ORAL
  Filled 2024-08-10 (×5): qty 1

## 2024-08-10 MED ORDER — HYDROCHLOROTHIAZIDE 12.5 MG PO TABS
12.5000 mg | ORAL_TABLET | Freq: Every day | ORAL | Status: DC
Start: 2024-08-11 — End: 2024-08-14
  Administered 2024-08-11 – 2024-08-14 (×4): 12.5 mg via ORAL
  Filled 2024-08-10 (×5): qty 1

## 2024-08-10 MED ORDER — CARVEDILOL 25 MG PO TABS
25.0000 mg | ORAL_TABLET | Freq: Two times a day (BID) | ORAL | Status: DC
  Administered 2024-08-11 – 2024-08-14 (×7): 25 mg via ORAL
  Filled 2024-08-10 (×7): qty 1

## 2024-08-10 MED ORDER — LIDOCAINE 5 % EX PTCH
1.0000 | MEDICATED_PATCH | CUTANEOUS | Status: DC
  Administered 2024-08-10 – 2024-08-13 (×4): 1 via TRANSDERMAL
  Filled 2024-08-10 (×5): qty 1

## 2024-08-10 MED ORDER — SODIUM CHLORIDE 0.9 % IV SOLN
250.0000 mg | Freq: Four times a day (QID) | INTRAVENOUS | Status: AC
  Administered 2024-08-10 – 2024-08-13 (×12): 250 mg via INTRAVENOUS
  Filled 2024-08-10 (×13): qty 250

## 2024-08-10 MED ORDER — NITROGLYCERIN 0.4 MG SL SUBL
0.4000 mg | SUBLINGUAL_TABLET | SUBLINGUAL | Status: DC | PRN
  Administered 2024-08-11: 0.4 mg via SUBLINGUAL
  Filled 2024-08-10: qty 1

## 2024-08-10 MED ORDER — ASPIRIN 81 MG PO TBEC
81.0000 mg | DELAYED_RELEASE_TABLET | Freq: Every day | ORAL | Status: DC
  Administered 2024-08-11 – 2024-08-14 (×4): 81 mg via ORAL
  Filled 2024-08-10 (×4): qty 1

## 2024-08-10 MED ORDER — ENOXAPARIN SODIUM 40 MG/0.4ML IJ SOSY
40.0000 mg | PREFILLED_SYRINGE | INTRAMUSCULAR | Status: DC
  Administered 2024-08-10 – 2024-08-13 (×4): 40 mg via SUBCUTANEOUS
  Filled 2024-08-10 (×4): qty 0.4

## 2024-08-10 MED ORDER — SODIUM CHLORIDE 0.9 % IV SOLN
2.0000 g | INTRAVENOUS | Status: DC
  Administered 2024-08-10 – 2024-08-13 (×4): 2 g via INTRAVENOUS
  Filled 2024-08-10 (×4): qty 20

## 2024-08-10 MED ORDER — SULFAMETHOXAZOLE-TRIMETHOPRIM 800-160 MG PO TABS
1.0000 | ORAL_TABLET | ORAL | Status: DC
  Administered 2024-08-11 – 2024-08-14 (×2): 1 via ORAL
  Filled 2024-08-10 (×2): qty 1

## 2024-08-10 MED ORDER — SODIUM CHLORIDE 0.9 % IV SOLN
500.0000 mg | INTRAVENOUS | Status: DC
  Administered 2024-08-11 (×2): 500 mg via INTRAVENOUS
  Filled 2024-08-10 (×2): qty 5

## 2024-08-10 MED ORDER — INSULIN GLARGINE 100 UNIT/ML ~~LOC~~ SOLN
10.0000 [IU] | Freq: Every day | SUBCUTANEOUS | Status: DC
Start: 2024-08-11 — End: 2024-08-11
  Administered 2024-08-11: 10 [IU] via SUBCUTANEOUS
  Filled 2024-08-10: qty 0.1

## 2024-08-10 MED ORDER — INSULIN ASPART 100 UNIT/ML IJ SOLN
0.0000 [IU] | Freq: Three times a day (TID) | INTRAMUSCULAR | Status: DC
  Administered 2024-08-11: 20 [IU] via SUBCUTANEOUS
  Administered 2024-08-11: 11 [IU] via SUBCUTANEOUS
  Administered 2024-08-11: 7 [IU] via SUBCUTANEOUS
  Administered 2024-08-12: 11 [IU] via SUBCUTANEOUS
  Administered 2024-08-12: 4 [IU] via SUBCUTANEOUS
  Administered 2024-08-12: 11 [IU] via SUBCUTANEOUS
  Administered 2024-08-13: 4 [IU] via SUBCUTANEOUS
  Administered 2024-08-13: 7 [IU] via SUBCUTANEOUS
  Administered 2024-08-13: 11 [IU] via SUBCUTANEOUS
  Administered 2024-08-14: 4 [IU] via SUBCUTANEOUS
  Filled 2024-08-10 (×10): qty 1

## 2024-08-10 MED ORDER — INSULIN ASPART 100 UNIT/ML IJ SOLN
0.0000 [IU] | Freq: Every day | INTRAMUSCULAR | Status: DC
  Administered 2024-08-11: 3 [IU] via SUBCUTANEOUS
  Administered 2024-08-12: 4 [IU] via SUBCUTANEOUS
  Filled 2024-08-10 (×2): qty 1

## 2024-08-10 MED ORDER — PANTOPRAZOLE SODIUM 40 MG PO TBEC
40.0000 mg | DELAYED_RELEASE_TABLET | Freq: Two times a day (BID) | ORAL | Status: AC
  Administered 2024-08-10 – 2024-08-11 (×3): 40 mg via ORAL
  Filled 2024-08-10 (×3): qty 1

## 2024-08-10 MED ORDER — SODIUM CHLORIDE 0.9 % IV SOLN: 8.0000 mg | Freq: Four times a day (QID) | INTRAVENOUS | Status: DC | PRN

## 2024-08-10 NOTE — Assessment & Plan Note (Signed)
 On the CT chest the patient is found to have changes consistent with possible bronchopneumonia. Will start the patient on IV ceftriaxone  and azithromycin . The patient will need to have a follow up CT chest in 3-6 weeks to ensure resolution.

## 2024-08-10 NOTE — ED Notes (Signed)
 Family at bedside.

## 2024-08-10 NOTE — Assessment & Plan Note (Signed)
 Noted. The patient has been on steroids at home for this problem. The patient is also complaining of increased headaches and recent cognitive changes. Will check CT head to rule out stroke. No lateralizing changes.

## 2024-08-10 NOTE — ED Triage Notes (Signed)
 Patient to ED from [facility] for SOB. Ongoing for the past 2 weeks- dyspnea with exertion. Pt poor historian- stating she is here to look at her coronary arteries. No report received from [facility] or note in chart. NAD noted- speaking in full sentences.

## 2024-08-10 NOTE — Assessment & Plan Note (Signed)
 Noted. The patient will be continued on Lantus , SSI, and trandgenta. High dose SSI has been ordered to cover her for her high dose steroids. This will likely need adjustment over the next couple of days.

## 2024-08-10 NOTE — Assessment & Plan Note (Addendum)
 The patient states that she has had progression shortness of breath on exertion over the past couple of months. She states that it has become quite severe. She visited her rheumatologist. MRA was obtained that demonstrated inflammation of her aorta. She was told to present to the ED to be admitted for high dose steroids x 72 hours with protonix  and oral bactrim .

## 2024-08-10 NOTE — Assessment & Plan Note (Signed)
 Noted. Continued patient's amlodipine , carvedilol , and hydrochlorothiazide .

## 2024-08-10 NOTE — H&P (Signed)
 History and Physical    Patient: Mary Cuevas FMW:[id number] DOB: July 24, 1961 DOA: 08/10/2024 DOS: the patient was seen and examined on 08/10/2024 PCP: Adina Buel HERO, MD  Patient coming from: Home  Chief Complaint:  Chief Complaint  Patient presents with   Shortness of Breath   HPI: Mary Cuevas is a 63 y.o. female with medical history significant of Dhest pain, DM II, DVT, hypertension, Giant Cell Arteritis, and history of stroke.    The patient states that she began having difficulty breathing with exertion about 2 weeks ago. Her family at bedside states that it has been more like 2 months. The patient also states that she has noticed headaches and some confusion. She visited her rheumatologist with these complaints. MRA of the heart demonstrated inflammation of the aorta. She was told to come to the ED for high dose steroids for 72 hours.  In the ED a CXR was obtained that demonstrated a 2.6 cm nodular opacity in the posterior lung base possibly representing bronchopneumonia. It will need follow up imaging in 6-12 weeks to demonstrate resolution. CT head is pending. EKG demonstrated a non-specific t-wave abnormality, but otherwise sinus rhythm.   The patient will be admitted to a telemetry bed. She will receive solumedrol 250 mg q 6 hours x 72 hours, bactrim  PO, and protonix  as per rheumatology's recommendations. She will also receive ceftriaxone  and azithromycin  for possible bronchopneumonia. Procalcitonin will also be ordered.  No fever or chills. The patient is not complaining of cough or chest pain. There is no abdominal pain, nausea/vomiting/constipation/diarrhea.  Review of Systems: As mentioned in the history of present illness. All other systems reviewed and are negative. Past Medical History:  Diagnosis Date   Chest pain    Diabetes mellitus without complication (HCC)    DVT (deep vein thrombosis) in pregnancy    Ectopic pregnancy    Hypertension    Stroke  (cerebrum) [facility])    Past Surgical History:  Procedure Laterality Date   ABDOMINAL HYSTERECTOMY     CARDIAC CATHETERIZATION N/A 11/18/2016   Procedure: Left Heart Cath and Coronary Angiography;  Surgeon: Lonni Hanson, MD;  Location: [facility] INVASIVE CV LAB;  Service: Cardiovascular;  Laterality: N/A;   KNEE ARTHROSCOPY     Social History:  reports that she has never smoked. She has never used smokeless tobacco. She reports that she does not drink alcohol and does not use drugs.  Allergies  Allergen Reactions   Demerol [Meperidine] Nausea And Vomiting   Vicodin [Hydrocodone -Acetaminophen ] Nausea And Vomiting   Dilaudid [Hydromorphone Hcl] Nausea And Vomiting and Other (See Comments)     made me feel like I was crazy Requests not to take this medication again.  made me feel like I was crazy Requests not to take this medication again.    Family History  Problem Relation Age of Onset   Breast cancer Maternal Aunt        50's   Breast cancer Cousin        50's    Prior to Admission medications   Medication Sig Start Date End Date Taking? Authorizing Provider  amLODipine  (NORVASC ) 10 MG tablet Take 10 mg by mouth daily.   Yes [provider]  aspirin  EC 81 MG tablet Take 81 mg by mouth daily.   Yes [provider]  Biotin  1000 MCG tablet Take 1,000 mcg by mouth daily.   Yes [provider]  carvedilol  (COREG ) 25 MG tablet Take 1 tablet (25 mg total) by  mouth 2 (two) times daily with a meal. 07/09/21  Yes Susen Pastor, MD  hydrochlorothiazide  (HYDRODIURIL ) 25 MG tablet Take 25 mg by mouth daily. 05/23/24  Yes [provider]  JARDIANCE 25 MG TABS tablet Take 25 mg by mouth daily. 06/22/24  Yes [provider]  meloxicam (MOBIC) 15 MG tablet Take 15 mg by mouth daily.   Yes [provider]  nitroGLYCERIN  (NITROSTAT ) 0.4 MG SL tablet Place 0.4 mg under the tongue every 5 (five) minutes as needed for chest pain.   Yes [provider]  omeprazole  (PRILOSEC) 20 MG capsule Take 1 capsule (20 mg total) by mouth 2 (two) times daily before a meal. 03/04/17  Yes Trudy Dorn BRAVO, MD  predniSONE  (DELTASONE ) 20 MG tablet Take 60 mg by mouth daily with breakfast. 08/10/24 10/09/24 Yes [provider]  rosuvastatin  (CRESTOR ) 40 MG tablet Take 40 mg by mouth at bedtime.    Yes [provider]  spironolactone (ALDACTONE) 25 MG tablet Take 25 mg by mouth daily. 05/23/24  Yes [provider]  TRADJENTA  5 MG TABS tablet Take 5 mg by mouth daily. 06/06/21  Yes [provider]  Vitamin D, Ergocalciferol, (DRISDOL) 50000 units CAPS capsule Take 50,000 Units by mouth every 7 (seven) days.   Yes [provider]  DULoxetine  (CYMBALTA ) 30 MG capsule Take 30 mg by mouth daily.  06/30/21  [provider]    Physical Exam: Vitals:   08/10/24 1542 08/10/24 1543 08/10/24 1957 08/10/24 2103  BP: (!) 192/103  (!) 152/80 (!) 166/87  Pulse: 90  84 83  Resp: 17  17 18   Temp: 97.8 F (36.6 C)  98.7 F (37.1 C) 97.6 F (36.4 C)  TempSrc: Oral  Oral   SpO2: 99%  99% 97%  Weight:  68 kg    Height:  5' 3 (1.6 m)     Exam:  Constitutional:  The patient is awake, alert, and oriented x 3. No acute distress. Eyes:  pupils and irises appear normal Normal lids and conjunctivae ENMT:  grossly normal hearing  Lips appear normal external ears, nose appear normal Oropharynx: mucosa, tongue,posterior pharynx appear normal Neck:  neck appears normal, no masses, normal ROM, supple no thyromegaly Respiratory:  No increased work of breathing. No wheezes, rales, or rhonchi No tactile fremitus Cardiovascular:  Regular rate and rhythm No murmurs, ectopy, or gallups. No lateral PMI. No thrills. Abdomen:  Abdomen is soft, non-tender, non-distended No hernias, masses, or organomegaly Normoactive bowel sounds.  Musculoskeletal:  No cyanosis, clubbing, or edema Skin:  No rashes,  lesions, ulcers palpation of skin: no induration or nodules Neurologic:  CN 2-12 intact Sensation all 4 extremities intact Psychiatric:  Mental status Mood, affect appropriate Orientation to person, place, time  judgment and insight appear intact  Data Reviewed:  Hemoglobin A1c of 7.0. CBC BMP CXR  Assessment and Plan: Dyspnea on exertion The patient states that she has had progression shortness of breath on exertion over the past couple of months. She states that it has become quite severe. She visited her rheumatologist. MRA was obtained that demonstrated inflammation of her aorta. She was told to present to the ED to be admitted for high dose steroids x 72 hours with protonix  and oral bactrim .  Giant cell aortitis [facility]) The patient presented to her rheumatologist complaining of worsening dyspnea on exertion. MRA was obtained that demonstrated inflammation of her aorta. She was told to present to the ED to be admitted for high  dose steroids x 72 hours with protonix  and oral bactrim .   Essential hypertension Noted. Continued patient's amlodipine , carvedilol , and hydrochlorothiazide .   Giant cell arteritis syndrome (HCC) Noted. The patient has been on steroids at home for this problem. The patient is also complaining of increased headaches and recent cognitive changes. Will check CT head to rule out stroke. No lateralizing changes.  Diabetes mellitus, type II (HCC) Noted. The patient will be continued on Lantus , SSI, and trandgenta. High dose SSI has been ordered to cover her for her high dose steroids. This will likely need adjustment over the next couple of days.  Bronchopneumonia On the CT chest the patient is found to have changes consistent with possible bronchopneumonia. Will start the patient on IV ceftriaxone  and azithromycin . The patient will need to have a follow up CT chest in 3-6 weeks to ensure resolution.      Advance Care Planning:   Code Status: Full Code    Consults: None  Family Communication: Family at bedside.  Severity of Illness: The appropriate patient status for this patient is INPATIENT. Inpatient status is judged to be reasonable and necessary in order to provide the required intensity of service to ensure the patient's safety. The patient's presenting symptoms, physical exam findings, and initial radiographic and laboratory data in the context of their chronic comorbidities is felt to place them at high risk for further clinical deterioration. Furthermore, it is not anticipated that the patient will be medically stable for discharge from the hospital within 2 midnights of admission.   * I certify that at the point of admission it is my clinical judgment that the patient will require inpatient hospital care spanning beyond 2 midnights from the point of admission due to high intensity of service, high risk for further deterioration and high frequency of surveillance required.*  Author: Yeva Bissette, DO 08/10/2024 9:44 PM  For on call review [email or web address].

## 2024-08-10 NOTE — ED Provider Notes (Signed)
 [facility] Provider Note    Event Date/Time   First MD Initiated Contact with Patient 08/10/24 1642     (approximate)   History   Shortness of Breath   HPI  Mary Cuevas is a 63 year old female with history of temporal arteritis presenting to the emergency department for evaluation of her vasculitis.  Patient had an appointment with rheumatology at [facility] today.  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  She was evaluated by her rheumatology team today who did recommend pulsed dose Solu-Medrol .  She was sent to the ER for anticipated admission for further management of this.   I did speak with PA Defoor with rheumatology at [facility] who presented to the ER.  He does note that patient presented with vision loss, positive ultrasound for temporal arteritis.  She did not have a biopsy performed as she had been on prednisone  for too long for this to be of utility.  Based on her MRI results, he recommends 250 mg of IV Solu-Medrol  every 6 hours for 72 hours.  After this, patient can be discharged on 60 mg of prednisone  daily until she has further follow-up with rheumatology where they will start to wean her.  He does recommend Bactrim  prophylactics of 1 tablet of double strength Bactrim  3 times a week to be continued until her rheumatology follow-up as well as Protonix  40 twice daily while she was receiving IV steroids can be transition to 40 daily at time of discharge.     Physical Exam   Triage Vital Signs: ED Triage Vitals  Encounter Vitals Group     BP 08/10/24 1542 (!) 192/103     Girls Systolic BP Percentile --      Girls Diastolic BP Percentile --      Boys Systolic BP Percentile --      Boys Diastolic BP Percentile --      Pulse Rate 08/10/24 1542 90     Resp 08/10/24 1542 17     Temp 08/10/24 1542  97.8 F (36.6 C)     Temp Source 08/10/24 1542 Oral     SpO2 08/10/24 1542 99 %     Weight 08/10/24 1543 149 lb 14.6 oz (68 kg)     Height 08/10/24 1543 5' 3 (1.6 m)     Head Circumference --      Peak Flow --      Pain Score 08/10/24 1543 0     Pain Loc --      Pain Education --      Exclude from Growth Chart --     Most recent vital signs: Vitals:   08/10/24 1542  BP: (!) 192/103  Pulse: 90  Resp: 17  Temp: 97.8 F (36.6 C)  SpO2: 99%     General: Awake, interactive  CV:  Regular rate, good peripheral perfusion.  Resp:  Unlabored respirations, lungs overall clear to auscultation. Abd:  Nondistended, soft, nontender Neuro:  Symmetric facial movement, fluid speech   ED Results / Procedures / Treatments   Labs (all labs ordered are listed, but only abnormal results are displayed) Labs Reviewed  BASIC METABOLIC PANEL WITH GFR - Abnormal; Notable for the following components:      Result Value   CO2 20 (*)    Glucose, Bld 235 (*)    Anion gap 16 (*)  All other components within normal limits  CBC  PROTIME-INR     EKG EKG independently reviewed and interpreted by myself demonstrates:  EKG demonstrates sinus rhythm at a rate of 88, PR 138, cures 72, QTc 401, nonspecific ST changes  RADIOLOGY Imaging independently reviewed and interpreted by myself demonstrates:  CXR with questionable new opacity the patient without reported fevers, cough suggestive of acute pneumonia  Formal Radiology Read:  DG Chest 2 View Result Date: 08/10/2024 CLINICAL DATA:  Dyspnea EXAM: DG CHEST 2V COMPARISON:  May 24, 2024, July 26, 2024 FINDINGS: In the posterior lung base on the lateral radiograph, there is a 2.6 cm nodular opacity, which is without correlate on the frontal radiograph. No pleural effusion or pneumothorax. No focal airspace consolidation, pleural effusion, or pneumothorax. No cardiomegaly.No acute fracture or destructive lesion. Multilevel thoracic osteophytosis.  IMPRESSION: In the posterior lung base on the lateral radiograph, there is a 2.6 cm nodular opacity, which is without correlate on the frontal radiograph. As there was no corresponding lesion on the prior MRI, this could represent a region of developing bronchopneumonia, in the correct clinical context. A 2 view chest radiograph in 6-12 weeks is recommended to document resolution once treatment is complete. Electronically Signed   By: Rogelia Myers M.D.   On: 08/10/2024 16:25    PROCEDURES:  Critical Care performed: Yes, see critical care procedure note(s)  CRITICAL CARE Performed by: Nilsa Dade   Total critical care time: 32 minutes  Critical care time was exclusive of separately billable procedures and treating other patients.  Critical care was necessary to treat or prevent imminent or life-threatening deterioration.  Critical care was time spent personally by me on the following activities: development of treatment plan with patient and/or surrogate as well as nursing, discussions with consultants, evaluation of patient's response to treatment, examination of patient, obtaining history from patient or surrogate, ordering and performing treatments and interventions, ordering and review of laboratory studies, ordering and review of radiographic studies, pulse oximetry and re-evaluation of patient's condition.   Procedures   MEDICATIONS ORDERED IN ED: Medications  methylPREDNISolone  sodium succinate (SOLU-MEDROL ) 250 mg in sodium chloride  0.9 % 50 mL IVPB (has no administration in time range)  sulfamethoxazole -trimethoprim  (BACTRIM  DS) 800-160 MG per tablet 1 tablet (has no administration in time range)  pantoprazole  (PROTONIX ) EC tablet 40 mg (has no administration in time range)     IMPRESSION / MDM / ASSESSMENT AND PLAN / ED COURSE  I reviewed the triage vital signs and the nursing notes.  Differential diagnosis includes, but is not limited to, vasculitis with involvement of the  aorta, lower suspicion pneumonia, pneumothorax, other acute cardiopulmonary process  Patient's presentation is most consistent with acute presentation with potential threat to life or bodily function.  63 year old female presenting with known temporal arteritis and MRI concerning for inflammatory pathology of her aorta, sent to the ER by rheumatology for high-dose steroid therapy.  See recommendations in HPI above, summarized below.  Will reach out to hospitalist team for admission.  Treatment recommendations: 250 mg of IV Solu-Medrol  every 6 hours x 72 hours Bactrim  prophylaxis 3 times daily until rheumatology follow-up Protonix  40 twice daily while receiving IV steroids, transition to 40 daily at time of discharge until rheum follow up Discharge on prednisone  60 mg daily until rheumatology follow-up  1838 Case discussed with hospitalist team.  They will evaluate for anticipated admission.      FINAL CLINICAL IMPRESSION(S) / ED DIAGNOSES   Final diagnoses:  Large  vessel vasculitis (HCC)     Rx / DC Orders   ED Discharge Orders     None        Note:  This document was prepared using Dragon voice recognition software and may include unintentional dictation errors.   Levander Slate, MD 08/10/24 RONOLD

## 2024-08-10 NOTE — Assessment & Plan Note (Signed)
 The patient presented to her rheumatologist complaining of worsening dyspnea on exertion. MRA was obtained that demonstrated inflammation of her aorta. She was told to present to the ED to be admitted for high dose steroids x 72 hours with protonix  and oral bactrim .

## 2024-08-11 DIAGNOSIS — I10 Essential (primary) hypertension: Secondary | ICD-10-CM

## 2024-08-11 DIAGNOSIS — E663 Overweight: Secondary | ICD-10-CM

## 2024-08-11 DIAGNOSIS — M316 Other giant cell arteritis: Secondary | ICD-10-CM | POA: Diagnosis not present

## 2024-08-11 DIAGNOSIS — J180 Bronchopneumonia, unspecified organism: Secondary | ICD-10-CM | POA: Diagnosis not present

## 2024-08-11 DIAGNOSIS — E1165 Type 2 diabetes mellitus with hyperglycemia: Secondary | ICD-10-CM | POA: Diagnosis not present

## 2024-08-11 LAB — GLUCOSE, CAPILLARY
Glucose-Capillary: 378 mg/dL — ABNORMAL HIGH (ref 70–99)
Glucose-Capillary: 289 mg/dL — ABNORMAL HIGH (ref 70–99)
Glucose-Capillary: 206 mg/dL — ABNORMAL HIGH (ref 70–99)
Glucose-Capillary: 295 mg/dL — ABNORMAL HIGH (ref 70–99)

## 2024-08-11 LAB — BASIC METABOLIC PANEL WITH GFR
Anion gap: 13 (ref 5–15)
BUN: 21 mg/dL — ABNORMAL HIGH (ref 6–20)
CO2: 23 mmol/L (ref 22–32)
Calcium: 9.3 mg/dL (ref 8.9–10.3)
Chloride: 104 mmol/L (ref 98–111)
Creatinine, Ser: 0.87 mg/dL (ref 0.44–1.00)
GFR, Estimated: >60 mL/min (ref >60)
Glucose, Bld: 302 mg/dL — ABNORMAL HIGH (ref 70–99)
Potassium: 3.6 mmol/L (ref 3.5–5.1)
Sodium: 140 mmol/L (ref 135–145)

## 2024-08-11 LAB — CBC
HCT: 36.2 % (ref 36.0–46.0)
Hemoglobin: 11.9 g/dL — ABNORMAL LOW (ref 12.0–15.0)
MCH: 27 pg (ref 26.0–34.0)
MCHC: 32.9 g/dL (ref 30.0–36.0)
MCV: 82.1 fL (ref 80.0–100.0)
Platelets: 273 K/uL (ref 150–400)
RBC: 4.41 MIL/uL (ref 3.87–5.11)
RDW: 13.5 % (ref 11.5–15.5)
WBC: 7.1 K/uL (ref 4.0–10.5)
nRBC: 0 % (ref 0.0–0.2)

## 2024-08-11 LAB — HEMOGLOBIN A1C
Hgb A1c MFr Bld: 8.3 % — ABNORMAL HIGH (ref 4.8–5.6)
Mean Plasma Glucose: 191.51 mg/dL

## 2024-08-11 LAB — SEDIMENTATION RATE: Sed Rate: 22 mm/h (ref 0–30)

## 2024-08-11 LAB — HIV ANTIBODY (ROUTINE TESTING W REFLEX): HIV Screen 4th Generation wRfx: NONREACTIVE

## 2024-08-11 MED ORDER — INSULIN GLARGINE 100 UNIT/ML ~~LOC~~ SOLN
15.0000 [IU] | Freq: Every day | SUBCUTANEOUS | Status: DC
  Administered 2024-08-12: 15 [IU] via SUBCUTANEOUS
  Filled 2024-08-11: qty 0.15

## 2024-08-11 MED ORDER — MORPHINE SULFATE (PF) 2 MG/ML IV SOLN
2.0000 mg | INTRAVENOUS | Status: DC | PRN
  Administered 2024-08-12 – 2024-08-13 (×6): 2 mg via INTRAVENOUS
  Filled 2024-08-11 (×6): qty 1

## 2024-08-11 MED ORDER — NAPROXEN 500 MG PO TABS
500.0000 mg | ORAL_TABLET | Freq: Two times a day (BID) | ORAL | Status: DC
  Administered 2024-08-11 – 2024-08-14 (×7): 500 mg via ORAL
  Filled 2024-08-11 (×8): qty 1

## 2024-08-11 NOTE — Assessment & Plan Note (Addendum)
 On Norvasc , hydrochlorothiazide  and Coreg 

## 2024-08-11 NOTE — Assessment & Plan Note (Signed)
 BMI 26.56

## 2024-08-11 NOTE — Hospital Course (Addendum)
 63 y.o. female with medical history significant of Dhest pain, DM II, DVT, hypertension, Giant Cell Arteritis, and history of stroke.     The patient states that she began having difficulty breathing with exertion about 2 weeks ago. Her family at bedside states that it has been more like 2 months. The patient also states that she has noticed headaches and some confusion. She visited her rheumatologist with these complaints. MRA of the heart demonstrated inflammation of the aorta. She was told to come to the ED for high dose steroids for 72 hours.   In the ED a CXR was obtained that demonstrated a 2.6 cm nodular opacity in the posterior lung base possibly representing bronchopneumonia. It will need follow up imaging in 6-12 weeks to demonstrate resolution. CT head is pending. EKG demonstrated a non-specific t-wave abnormality, but otherwise sinus rhythm.    The patient will be admitted to a telemetry bed. She will receive solumedrol 250 mg q 6 hours x 72 hours, bactrim  PO, and protonix  as per rheumatology's recommendations. She will also receive ceftriaxone  and azithromycin  for possible bronchopneumonia. Procalcitonin will also be ordered.  9/12.  Patient complains of shortness of breath going on for a while.  Patient had an outpatient MRI showing increased T2 signal in the descending thoracic aorta and proximal abdominal aorta consistent with aortitis. 9/13.  Patient still has some shortness of breath and some pain. 9/14.  Patient still having some pain in her left back going through from the front 9/15.  Patient back on prednisone  60 mg daily.  Patient again declined insulin  and will follow-up with her PCP wants to go back on her usual meds.  Still having some discomfort but limited with pain medication secondary to reactions.

## 2024-08-11 NOTE — Plan of Care (Signed)

## 2024-08-11 NOTE — Progress Notes (Signed)
 Progress Note   Patient: Mary Cuevas FMW:[id number] DOB: 03-Nov-1961 DOA: 08/10/2024     1 DOS: the patient was seen and examined on 08/11/2024   Brief hospital course: 63 y.o. female with medical history significant of Dhest pain, DM II, DVT, hypertension, Giant Cell Arteritis, and history of stroke.     The patient states that she began having difficulty breathing with exertion about 2 weeks ago. Her family at bedside states that it has been more like 2 months. The patient also states that she has noticed headaches and some confusion. She visited her rheumatologist with these complaints. MRA of the heart demonstrated inflammation of the aorta. She was told to come to the ED for high dose steroids for 72 hours.   In the ED a CXR was obtained that demonstrated a 2.6 cm nodular opacity in the posterior lung base possibly representing bronchopneumonia. It will need follow up imaging in 6-12 weeks to demonstrate resolution. CT head is pending. EKG demonstrated a non-specific t-wave abnormality, but otherwise sinus rhythm.    The patient will be admitted to a telemetry bed. She will receive solumedrol 250 mg q 6 hours x 72 hours, bactrim  PO, and protonix  as per rheumatology's recommendations. She will also receive ceftriaxone  and azithromycin  for possible bronchopneumonia. Procalcitonin will also be ordered.  9/12.  Patient complains of shortness of breath going on for a while.  Patient had an outpatient MRI showing increased T2 signal in the descending thoracic aorta and proximal abdominal aorta consistent with aortitis.  Assessment and Plan: * Giant cell arteritis syndrome (HCC) Rheumatology recommended high-dose Solu-Medrol  for 3 days.  And Bactrim .  Patient on prednisone  60 mg at home.  Outpatient MRI showing aortitis.  Bronchopneumonia Patient placed on Rocephin  and Zithromax .  Overweight (BMI 25.0-29.9) BMI 26.56  Uncontrolled type 2 diabetes mellitus with hyperglycemia, without  long-term current use of insulin  (HCC) Patient on Tradjenta  and sliding scale.  Started on low-dose Lantus .  Will increase to 15 units nightly.  Essential hypertension On Norvasc , hydrochlorothiazide  and Coreg         Subjective: Patient feeling the same.  Still having some shortness of breath.  Sent down for aortitis for high-dose steroids.  Physical Exam: Vitals:   08/11/24 0325 08/11/24 0806 08/11/24 0925 08/11/24 1219  BP: 117/70 135/87 135/87 116/69  Pulse: 83 87  88  Resp: 18 17  17   Temp: 98.5 F (36.9 C) 97.6 F (36.4 C)  (!) 97.5 F (36.4 C)  TempSrc: Oral Oral  Oral  SpO2: 99% 100%  97%  Weight:      Height:       Physical Exam HENT:     Head: Normocephalic.     Mouth/Throat:     Pharynx: No oropharyngeal exudate.  Eyes:     General: Lids are normal.  Cardiovascular:     Rate and Rhythm: Normal rate and regular rhythm.     Heart sounds: S1 normal and S2 normal. Murmur heard.     Systolic murmur is present with a grade of 2/6.  Pulmonary:     Breath sounds: Examination of the right-lower field reveals decreased breath sounds. Examination of the left-lower field reveals decreased breath sounds. Decreased breath sounds present. No wheezing, rhonchi or rales.  Abdominal:     Palpations: Abdomen is soft.     Tenderness: There is no abdominal tenderness.  Musculoskeletal:     Right lower leg: Swelling present.     Left lower leg: Swelling present.  Skin:  General: Skin is warm.     Findings: No rash.  Neurological:     Mental Status: She is alert and oriented to person, place, and time.     Data Reviewed: Outpatient MRI reviewed provide creatinine 0.87, white blood cell count 7.1, hemoglobin 11.9 platelet count 273   Disposition: Status is: Inpatient Remains inpatient appropriate because: Rheumatology recommended 3 days of high-dose steroids  Planned Discharge Destination: Home    Time spent: 28 minutes  Author: Charlie Patterson, MD 08/11/2024  3:22 PM  For on call review [email or web address].

## 2024-08-11 NOTE — Assessment & Plan Note (Addendum)
 Rheumatology recommended high-dose Solu-Medrol  for 3 days secondary to aortitis.  Patient will finish up high-dose steroids late this evening.  Prophylactic Bactrim .  Patient back on prednisone  60 mg daily.  ESR only 22.  Will need follow-up with rheumatology as outpatient for bone density test.  Stable for discharge.

## 2024-08-11 NOTE — Assessment & Plan Note (Addendum)
 Patient placed on Rocephin  and Zithromax .  Will switch over to Augmentin  and Zithromax  upon discharge

## 2024-08-11 NOTE — Assessment & Plan Note (Addendum)
 Patient on Tradjenta  and sliding scale.  Continue on Lantus .  Patient does not want to do insulin  at home.  Hemoglobin A1c 8.3.  With her being on long-term steroids insulin  would probably be the best option.  Patient still reluctant to do this and wants to speak with her PCP.  Can go back on her oral meds.  Also declined sulfonylurea.

## 2024-08-12 DIAGNOSIS — J180 Bronchopneumonia, unspecified organism: Secondary | ICD-10-CM | POA: Diagnosis not present

## 2024-08-12 DIAGNOSIS — E1165 Type 2 diabetes mellitus with hyperglycemia: Secondary | ICD-10-CM | POA: Diagnosis not present

## 2024-08-12 DIAGNOSIS — M316 Other giant cell arteritis: Secondary | ICD-10-CM | POA: Diagnosis not present

## 2024-08-12 DIAGNOSIS — E663 Overweight: Secondary | ICD-10-CM | POA: Diagnosis not present

## 2024-08-12 LAB — GLUCOSE, CAPILLARY
Glucose-Capillary: 281 mg/dL — ABNORMAL HIGH (ref 70–99)
Glucose-Capillary: 294 mg/dL — ABNORMAL HIGH (ref 70–99)
Glucose-Capillary: 192 mg/dL — ABNORMAL HIGH (ref 70–99)
Glucose-Capillary: 330 mg/dL — ABNORMAL HIGH (ref 70–99)

## 2024-08-12 MED ORDER — PREDNISONE 50 MG PO TABS
60.0000 mg | ORAL_TABLET | Freq: Every day | ORAL | Status: DC
  Administered 2024-08-14: 60 mg via ORAL
  Filled 2024-08-12: qty 1

## 2024-08-12 MED ORDER — INSULIN GLARGINE 100 UNIT/ML ~~LOC~~ SOLN
20.0000 [IU] | Freq: Every day | SUBCUTANEOUS | Status: DC
  Administered 2024-08-13: 20 [IU] via SUBCUTANEOUS
  Filled 2024-08-12: qty 0.2

## 2024-08-12 MED ORDER — DOCUSATE SODIUM 100 MG PO CAPS
100.0000 mg | ORAL_CAPSULE | Freq: Two times a day (BID) | ORAL | Status: DC
  Administered 2024-08-12 – 2024-08-13 (×3): 100 mg via ORAL
  Filled 2024-08-12 (×5): qty 1

## 2024-08-12 MED ORDER — AZITHROMYCIN 250 MG PO TABS
500.0000 mg | ORAL_TABLET | Freq: Every day | ORAL | Status: DC
Start: 2024-08-12 — End: 2024-08-14
  Administered 2024-08-12 – 2024-08-13 (×2): 500 mg via ORAL
  Filled 2024-08-12 (×2): qty 2

## 2024-08-12 NOTE — Progress Notes (Signed)
 Progress Note   Patient: Mary Cuevas FMW:[id number] DOB: 1961-08-06 DOA: 08/10/2024     2 DOS: the patient was seen and examined on 08/12/2024   Brief hospital course: 63 y.o. female with medical history significant of Dhest pain, DM II, DVT, hypertension, Giant Cell Arteritis, and history of stroke.     The patient states that she began having difficulty breathing with exertion about 2 weeks ago. Her family at bedside states that it has been more like 2 months. The patient also states that she has noticed headaches and some confusion. She visited her rheumatologist with these complaints. MRA of the heart demonstrated inflammation of the aorta. She was told to come to the ED for high dose steroids for 72 hours.   In the ED a CXR was obtained that demonstrated a 2.6 cm nodular opacity in the posterior lung base possibly representing bronchopneumonia. It will need follow up imaging in 6-12 weeks to demonstrate resolution. CT head is pending. EKG demonstrated a non-specific t-wave abnormality, but otherwise sinus rhythm.    The patient will be admitted to a telemetry bed. She will receive solumedrol 250 mg q 6 hours x 72 hours, bactrim  PO, and protonix  as per rheumatology's recommendations. She will also receive ceftriaxone  and azithromycin  for possible bronchopneumonia. Procalcitonin will also be ordered.  9/12.  Patient complains of shortness of breath going on for a while.  Patient had an outpatient MRI showing increased T2 signal in the descending thoracic aorta and proximal abdominal aorta consistent with aortitis. 9/13.  Patient still has some shortness of breath and some pain.  Assessment and Plan: * Giant cell arteritis syndrome (HCC) Rheumatology recommended high-dose Solu-Medrol  for 3 days secondary to aortitis.  Prophylactic Bactrim .  Patient on prednisone  60 mg at home.  ESR only 22.  Will need follow-up with rheumatology as outpatient for bone density  test.  Bronchopneumonia Patient placed on Rocephin  and Zithromax .  Overweight (BMI 25.0-29.9) BMI 26.56  Uncontrolled type 2 diabetes mellitus with hyperglycemia, without long-term current use of insulin  (HCC) Patient on Tradjenta  and sliding scale.  Continue on Lantus .  Will increase to 20 units nightly.  Patient does not want to do insulin  at home.  Hemoglobin A1c 8.3.  Essential hypertension On Norvasc , hydrochlorothiazide  and Coreg         Subjective: Patient still having some shortness of breath.  Still having some pain.  Feels a little bit better since coming in.  Mated with aortitis.  Physical Exam: Vitals:   08/12/24 0013 08/12/24 0417 08/12/24 0818 08/12/24 1140  BP: 128/86 120/80 117/71 120/78  Pulse: 87 91 91 85  Resp: 18 18    Temp: 98.1 F (36.7 C) 97.6 F (36.4 C) 97.7 F (36.5 C) 97.7 F (36.5 C)  TempSrc:      SpO2: 100% 100% 96% 97%  Weight:      Height:       Physical Exam HENT:     Head: Normocephalic.     Mouth/Throat:     Pharynx: No oropharyngeal exudate.  Eyes:     General: Lids are normal.  Cardiovascular:     Rate and Rhythm: Normal rate and regular rhythm.     Heart sounds: S1 normal and S2 normal. Murmur heard.     Systolic murmur is present with a grade of 2/6.  Pulmonary:     Breath sounds: Examination of the right-lower field reveals decreased breath sounds. Examination of the left-lower field reveals decreased breath sounds. Decreased breath sounds present.  No wheezing, rhonchi or rales.  Abdominal:     Palpations: Abdomen is soft.     Tenderness: There is no abdominal tenderness.  Musculoskeletal:     Right lower leg: Swelling present.     Left lower leg: Swelling present.  Skin:    General: Skin is warm.     Findings: No rash.  Neurological:     Mental Status: She is alert and oriented to person, place, and time.     Data Reviewed: Last 4 sugars 206, 295, 281 and 294  Disposition: Status is: Inpatient Remains  inpatient appropriate because: 3 days of high-dose IV steroids  Planned Discharge Destination: Home    Time spent: 28 minutes  Author: Charlie Patterson, MD 08/12/2024 1:37 PM  For on call review [email or web address].

## 2024-08-12 NOTE — Plan of Care (Signed)
  Problem: Fluid Volume: Goal: Ability to maintain a balanced intake and output will improve Outcome: Progressing   Problem: Nutritional: Goal: Maintenance of adequate nutrition will improve Outcome: Progressing   Problem: Clinical Measurements: Goal: Diagnostic test results will improve Outcome: Progressing   Problem: Nutrition: Goal: Adequate nutrition will be maintained Outcome: Progressing

## 2024-08-12 NOTE — Plan of Care (Signed)

## 2024-08-13 DIAGNOSIS — J180 Bronchopneumonia, unspecified organism: Secondary | ICD-10-CM | POA: Diagnosis not present

## 2024-08-13 DIAGNOSIS — E1165 Type 2 diabetes mellitus with hyperglycemia: Secondary | ICD-10-CM | POA: Diagnosis not present

## 2024-08-13 DIAGNOSIS — M549 Dorsalgia, unspecified: Secondary | ICD-10-CM | POA: Insufficient documentation

## 2024-08-13 DIAGNOSIS — E663 Overweight: Secondary | ICD-10-CM | POA: Diagnosis not present

## 2024-08-13 DIAGNOSIS — M316 Other giant cell arteritis: Secondary | ICD-10-CM | POA: Diagnosis not present

## 2024-08-13 LAB — GLUCOSE, CAPILLARY
Glucose-Capillary: 250 mg/dL — ABNORMAL HIGH (ref 70–99)
Glucose-Capillary: 264 mg/dL — ABNORMAL HIGH (ref 70–99)
Glucose-Capillary: 188 mg/dL — ABNORMAL HIGH (ref 70–99)
Glucose-Capillary: 197 mg/dL — ABNORMAL HIGH (ref 70–99)

## 2024-08-13 MED ORDER — PANTOPRAZOLE SODIUM 40 MG PO TBEC
40.0000 mg | DELAYED_RELEASE_TABLET | Freq: Every day | ORAL | Status: DC
  Administered 2024-08-13 – 2024-08-14 (×2): 40 mg via ORAL
  Filled 2024-08-13 (×2): qty 1

## 2024-08-13 MED ORDER — OXYCODONE HCL 5 MG PO TABS: 5.0000 mg | ORAL_TABLET | Freq: Four times a day (QID) | ORAL | Status: DC | PRN

## 2024-08-13 MED ORDER — TRAMADOL HCL 50 MG PO TABS
50.0000 mg | ORAL_TABLET | Freq: Three times a day (TID) | ORAL | Status: DC | PRN
  Filled 2024-08-13: qty 1

## 2024-08-13 MED ORDER — INSULIN GLARGINE 100 UNIT/ML ~~LOC~~ SOLN
15.0000 [IU] | Freq: Every day | SUBCUTANEOUS | Status: DC
  Administered 2024-08-14: 15 [IU] via SUBCUTANEOUS
  Filled 2024-08-13: qty 0.15

## 2024-08-13 MED ORDER — HYDROCODONE-ACETAMINOPHEN 5-325 MG PO TABS
1.0000 | ORAL_TABLET | ORAL | Status: DC | PRN
Start: 2024-08-13 — End: 2024-08-14

## 2024-08-13 MED ORDER — ORAL CARE MOUTH RINSE: 15.0000 mL | OROMUCOSAL | Status: DC | PRN

## 2024-08-13 NOTE — Progress Notes (Signed)
 Progress Note   Patient: Mary Cuevas FMW:[id number] DOB: 1961/09/22 DOA: 08/10/2024     3 DOS: the patient was seen and examined on 08/13/2024   Brief hospital course: 63 y.o. female with medical history significant of Dhest pain, DM II, DVT, hypertension, Giant Cell Arteritis, and history of stroke.     The patient states that she began having difficulty breathing with exertion about 2 weeks ago. Her family at bedside states that it has been more like 2 months. The patient also states that she has noticed headaches and some confusion. She visited her rheumatologist with these complaints. MRA of the heart demonstrated inflammation of the aorta. She was told to come to the ED for high dose steroids for 72 hours.   In the ED a CXR was obtained that demonstrated a 2.6 cm nodular opacity in the posterior lung base possibly representing bronchopneumonia. It will need follow up imaging in 6-12 weeks to demonstrate resolution. CT head is pending. EKG demonstrated a non-specific t-wave abnormality, but otherwise sinus rhythm.    The patient will be admitted to a telemetry bed. She will receive solumedrol 250 mg q 6 hours x 72 hours, bactrim  PO, and protonix  as per rheumatology's recommendations. She will also receive ceftriaxone  and azithromycin  for possible bronchopneumonia. Procalcitonin will also be ordered.  9/12.  Patient complains of shortness of breath going on for a while.  Patient had an outpatient MRI showing increased T2 signal in the descending thoracic aorta and proximal abdominal aorta consistent with aortitis. 9/13.  Patient still has some shortness of breath and some pain.  Assessment and Plan: * Giant cell arteritis syndrome (HCC) Rheumatology recommended high-dose Solu-Medrol  for 3 days secondary to aortitis.  Patient will finish up high-dose steroids late this evening.  Prophylactic Bactrim .  Patient on prednisone  60 mg at home.  ESR only 22.  Will need follow-up with rheumatology  as outpatient for bone density test.  Likely discharge home tomorrow.  Bronchopneumonia Patient placed on Rocephin  and Zithromax .  Back pain Suspect secondary to her aortitis.  Will start on Protonix .  Overweight (BMI 25.0-29.9) BMI 26.56  Uncontrolled type 2 diabetes mellitus with hyperglycemia, without long-term current use of insulin  (HCC) Patient on Tradjenta  and sliding scale.  Continue on Lantus .  Patient does not want to do insulin  at home.  Hemoglobin A1c 8.3.  With her being on long-term steroids insulin  would probably be the best option.  Patient still reluctant to do this and wants to speak with her PCP.  Essential hypertension On Norvasc , hydrochlorothiazide  and Coreg         Subjective: Patient still having some pain in her left back area.  Comes through from the center.  Felt okay walking around on flat ground yesterday.  Admitted with aortitis.  Physical Exam: Vitals:   08/12/24 2354 08/13/24 0427 08/13/24 0818 08/13/24 1122  BP: 132/87 (!) 129/91 (!) 150/91 129/87  Pulse: 82 77 90 76  Resp: 18 18 18 18   Temp: 97.7 F (36.5 C) 97.7 F (36.5 C) (!) 97.5 F (36.4 C) 97.7 F (36.5 C)  TempSrc:   Oral Oral  SpO2: 98% 97% 100% 96%  Weight:      Height:       Physical Exam HENT:     Head: Normocephalic.     Mouth/Throat:     Pharynx: No oropharyngeal exudate.  Eyes:     General: Lids are normal.  Cardiovascular:     Rate and Rhythm: Normal rate and regular rhythm.  Heart sounds: S1 normal and S2 normal. Murmur heard.     Systolic murmur is present with a grade of 2/6.  Pulmonary:     Breath sounds: Examination of the right-lower field reveals decreased breath sounds. Examination of the left-lower field reveals decreased breath sounds. Decreased breath sounds present. No wheezing, rhonchi or rales.  Abdominal:     Palpations: Abdomen is soft.     Tenderness: There is no abdominal tenderness.  Musculoskeletal:     Right lower leg: Swelling present.      Left lower leg: Swelling present.  Skin:    General: Skin is warm.     Findings: No rash.  Neurological:     Mental Status: She is alert and oriented to person, place, and time.     Data Reviewed: Last creatinine 0.87, last hemoglobin 11.9  Disposition: Status is: Inpatient Remains inpatient appropriate because: Patient will finish up high-dose steroids this evening.  Will discharge tomorrow morning.  Start Protonix .  Ambulate again today.  Planned Discharge Destination: Home    Time spent: 28 minutes  Author: Charlie Patterson, MD 08/13/2024 1:03 PM  For on call review [email or web address].

## 2024-08-13 NOTE — Plan of Care (Signed)
  Problem: Coping: Goal: Ability to adjust to condition or change in health will improve Outcome: Progressing   Problem: Metabolic: Goal: Ability to maintain appropriate glucose levels will improve Outcome: Progressing   Problem: Skin Integrity: Goal: Risk for impaired skin integrity will decrease Outcome: Progressing   Problem: Clinical Measurements: Goal: Ability to maintain clinical measurements within normal limits will improve Outcome: Progressing

## 2024-08-13 NOTE — Plan of Care (Signed)

## 2024-08-13 NOTE — Assessment & Plan Note (Addendum)
 Suspect secondary to her aortitis.  Will continue on Protonix .

## 2024-08-14 ENCOUNTER — Other Ambulatory Visit: Payer: Self-pay

## 2024-08-14 DIAGNOSIS — M316 Other giant cell arteritis: Secondary | ICD-10-CM | POA: Diagnosis not present

## 2024-08-14 DIAGNOSIS — J180 Bronchopneumonia, unspecified organism: Secondary | ICD-10-CM | POA: Diagnosis not present

## 2024-08-14 DIAGNOSIS — E663 Overweight: Secondary | ICD-10-CM | POA: Diagnosis not present

## 2024-08-14 DIAGNOSIS — E1165 Type 2 diabetes mellitus with hyperglycemia: Secondary | ICD-10-CM | POA: Diagnosis not present

## 2024-08-14 LAB — BASIC METABOLIC PANEL WITH GFR
Anion gap: 10 (ref 5–15)
BUN: 36 mg/dL — ABNORMAL HIGH (ref 6–20)
CO2: 26 mmol/L (ref 22–32)
Calcium: 8.8 mg/dL — ABNORMAL LOW (ref 8.9–10.3)
Chloride: 107 mmol/L (ref 98–111)
Creatinine, Ser: 1.09 mg/dL — ABNORMAL HIGH (ref 0.44–1.00)
GFR, Estimated: 58 mL/min — ABNORMAL LOW (ref >60)
Glucose, Bld: 215 mg/dL — ABNORMAL HIGH (ref 70–99)
Potassium: 3.1 mmol/L — ABNORMAL LOW (ref 3.5–5.1)
Sodium: 143 mmol/L (ref 135–145)

## 2024-08-14 LAB — CBC
HCT: 34.3 % — ABNORMAL LOW (ref 36.0–46.0)
Hemoglobin: 11.4 g/dL — ABNORMAL LOW (ref 12.0–15.0)
MCH: 27 pg (ref 26.0–34.0)
MCHC: 33.2 g/dL (ref 30.0–36.0)
MCV: 81.3 fL (ref 80.0–100.0)
Platelets: 268 K/uL (ref 150–400)
RBC: 4.22 MIL/uL (ref 3.87–5.11)
RDW: 13.5 % (ref 11.5–15.5)
WBC: 11.8 K/uL — ABNORMAL HIGH (ref 4.0–10.5)
nRBC: 0 % (ref 0.0–0.2)

## 2024-08-14 LAB — GLUCOSE, CAPILLARY: Glucose-Capillary: 186 mg/dL — ABNORMAL HIGH (ref 70–99)

## 2024-08-14 MED ORDER — PANTOPRAZOLE SODIUM 40 MG PO TBEC: 40.0000 mg | DELAYED_RELEASE_TABLET | Freq: Every day | ORAL | 0 refills | Status: AC

## 2024-08-14 MED ORDER — AMOXICILLIN-POT CLAVULANATE 875-125 MG PO TABS
1.0000 | ORAL_TABLET | Freq: Two times a day (BID) | ORAL | 0 refills | Status: DC
  Filled 2024-08-14: qty 4, 2d supply, fill #0

## 2024-08-14 MED ORDER — AMOXICILLIN-POT CLAVULANATE 875-125 MG PO TABS: 1.0000 | ORAL_TABLET | Freq: Two times a day (BID) | ORAL | 0 refills | Status: AC

## 2024-08-14 MED ORDER — PANTOPRAZOLE SODIUM 40 MG PO TBEC
40.0000 mg | DELAYED_RELEASE_TABLET | Freq: Every day | ORAL | 0 refills | Status: DC
  Filled 2024-08-14: qty 30, 30d supply, fill #0

## 2024-08-14 MED ORDER — SULFAMETHOXAZOLE-TRIMETHOPRIM 800-160 MG PO TABS
1.0000 | ORAL_TABLET | ORAL | 0 refills | Status: DC
  Filled 2024-08-14: qty 12, 28d supply, fill #0

## 2024-08-14 MED ORDER — ACETAMINOPHEN 325 MG PO TABS: 650.0000 mg | ORAL_TABLET | Freq: Four times a day (QID) | ORAL | Status: AC | PRN

## 2024-08-14 MED ORDER — SULFAMETHOXAZOLE-TRIMETHOPRIM 800-160 MG PO TABS: 1.0000 | ORAL_TABLET | ORAL | 0 refills | Status: AC

## 2024-08-14 NOTE — Plan of Care (Signed)

## 2024-08-14 NOTE — TOC CM/SW Note (Signed)
 Transition of Care [facility]) - Inpatient Brief Assessment   Patient Details  Name: Mary Cuevas MRN: [id number] Date of Birth: Feb 13, 1961  [facility] of [facility] CM/SW Contact:    Lauraine JAYSON Carpen, LCSW Phone Number: 08/14/2024, 8:25 AM   Clinical Narrative: Patient has orders to discharge home today. Chart reviewed. No TOC needs identified. CSW signing off.  Transition of Care Asessment: Insurance and Status: Insurance coverage has been reviewed Patient has primary care physician: Yes Home environment has been reviewed: Single family home Prior level of function:: Not documented Prior/Current Home Services: No current home services Social Drivers of Health Review: SDOH reviewed no interventions necessary Readmission risk has been reviewed: Yes Transition of care needs: no transition of care needs at this time

## 2024-08-14 NOTE — Discharge Summary (Signed)
 Physician Discharge Summary   Patient: Mary Cuevas MRN: [id number] DOB: 11-30-1961  Admit date:     08/10/2024  Discharge date: 08/14/24  Discharge Physician: Charlie Patterson   PCP: Adina Buel HERO, MD   Recommendations at discharge:   Follow-up PCP 5 days Follow-up rheumatology  Discharge Diagnoses: Principal Problem:   Giant cell arteritis syndrome (HCC) Active Problems:   Bronchopneumonia   Essential hypertension   Diabetes mellitus, type II (HCC)   Giant cell aortitis (HCC)   Dyspnea on exertion   Uncontrolled type 2 diabetes mellitus with hyperglycemia, without long-term current use of insulin  (HCC)   Overweight (BMI 25.0-29.9)   Back pain  I will try to get Hospital Course: 63 y.o. female with medical history significant of Dhest pain, DM II, DVT, hypertension, Giant Cell Arteritis, and history of stroke.     The patient states that she began having difficulty breathing with exertion about 2 weeks ago. Her family at bedside states that it has been more like 2 months. The patient also states that she has noticed headaches and some confusion. She visited her rheumatologist with these complaints. MRA of the heart demonstrated inflammation of the aorta. She was told to come to the ED for high dose steroids for 72 hours.   In the ED a CXR was obtained that demonstrated a 2.6 cm nodular opacity in the posterior lung base possibly representing bronchopneumonia. It will need follow up imaging in 6-12 weeks to demonstrate resolution. CT head is pending. EKG demonstrated a non-specific t-wave abnormality, but otherwise sinus rhythm.    The patient will be admitted to a telemetry bed. She will receive solumedrol 250 mg q 6 hours x 72 hours, bactrim  PO, and protonix  as per rheumatology's recommendations. She will also receive ceftriaxone  and azithromycin  for possible bronchopneumonia. Procalcitonin will also be ordered.  9/12.  Patient complains of shortness of breath going on for  a while.  Patient had an outpatient MRI showing increased T2 signal in the descending thoracic aorta and proximal abdominal aorta consistent with aortitis. 9/13.  Patient still has some shortness of breath and some pain. 9/14.  Patient still having some pain in her left back going through from the front 9/15.  Patient back on prednisone  60 mg daily.  Patient again declined insulin  and will follow-up with her PCP wants to go back on her usual meds.  Still having some discomfort but limited with pain medication secondary to reactions.  Assessment and Plan: * Giant cell arteritis syndrome (HCC) Rheumatology recommended high-dose Solu-Medrol  for 3 days secondary to aortitis.  Patient will finish up high-dose steroids late this evening.  Prophylactic Bactrim .  Patient back on prednisone  60 mg daily.  ESR only 22.  Will need follow-up with rheumatology as outpatient for bone density test.  Stable for discharge.  Bronchopneumonia Patient placed on Rocephin  and Zithromax .  Will switch over to Augmentin  and Zithromax  upon discharge  Back pain Suspect secondary to her aortitis.  Will continue on Protonix .  Overweight (BMI 25.0-29.9) BMI 26.56  Uncontrolled type 2 diabetes mellitus with hyperglycemia, without long-term current use of insulin  (HCC) Patient on Tradjenta  and sliding scale.  Continue on Lantus .  Patient does not want to do insulin  at home.  Hemoglobin A1c 8.3.  With her being on long-term steroids insulin  would probably be the best option.  Patient still reluctant to do this and wants to speak with her PCP.  Can go back on her oral meds.  Also declined sulfonylurea.  Essential  hypertension On Norvasc , hydrochlorothiazide  and Coreg          Consultants: None Procedures performed: None Disposition: Home Diet recommendation:  Cardiac and Carb modified diet DISCHARGE MEDICATION: Allergies as of 08/14/2024       Reactions   Demerol [meperidine] Nausea And Vomiting   Vicodin  [hydrocodone -acetaminophen ] Nausea And Vomiting   Dilaudid [hydromorphone Hcl] Nausea And Vomiting, Other (See Comments)    made me feel like I was crazy Requests not to take this medication again.  made me feel like I was crazy Requests not to take this medication again.        Medication List     STOP taking these medications    omeprazole  20 MG capsule Commonly known as: PRILOSEC   spironolactone 25 MG tablet Commonly known as: ALDACTONE       TAKE these medications    acetaminophen  325 MG tablet Commonly known as: Tylenol  Take 2 tablets (650 mg total) by mouth every 6 (six) hours as needed.   amLODipine  10 MG tablet Commonly known as: NORVASC  Take 10 mg by mouth daily.   amoxicillin -clavulanate 875-125 MG tablet Commonly known as: AUGMENTIN  Take 1 tablet by mouth 2 (two) times daily for 2 days.   aspirin  EC 81 MG tablet Take 81 mg by mouth daily.   Biotin  1000 MCG tablet Take 1,000 mcg by mouth daily.   carvedilol  25 MG tablet Commonly known as: COREG  Take 1 tablet (25 mg total) by mouth 2 (two) times daily with a meal.   hydrochlorothiazide  25 MG tablet Commonly known as: HYDRODIURIL  Take 25 mg by mouth daily.   Jardiance 25 MG Tabs tablet Generic drug: empagliflozin Take 25 mg by mouth daily.   meloxicam 15 MG tablet Commonly known as: MOBIC Take 15 mg by mouth daily.   nitroGLYCERIN  0.4 MG SL tablet Commonly known as: NITROSTAT  Place 0.4 mg under the tongue every 5 (five) minutes as needed for chest pain.   pantoprazole  40 MG tablet Commonly known as: PROTONIX  Take 1 tablet (40 mg total) by mouth daily.   predniSONE  20 MG tablet Commonly known as: DELTASONE  Take 60 mg by mouth daily with breakfast.   rosuvastatin  40 MG tablet Commonly known as: CRESTOR  Take 40 mg by mouth at bedtime.   sulfamethoxazole -trimethoprim  800-160 MG tablet Commonly known as: BACTRIM  DS Take 1 tablet by mouth 3 (three) times a week. Start taking on:  August 16, 2024   Tradjenta  5 MG Tabs tablet Generic drug: linagliptin  Take 5 mg by mouth daily.   Vitamin D (Ergocalciferol) 1.25 MG (50000 UNIT) Caps capsule Commonly known as: DRISDOL Take 50,000 Units by mouth every 7 (seven) days.        Discharge Exam: Filed Weights   08/10/24 1543  Weight: 68 kg   Physical Exam HENT:     Head: Normocephalic.     Mouth/Throat:     Pharynx: No oropharyngeal exudate.  Eyes:     General: Lids are normal.  Cardiovascular:     Rate and Rhythm: Normal rate and regular rhythm.     Heart sounds: S1 normal and S2 normal. Murmur heard.     Systolic murmur is present with a grade of 2/6.  Pulmonary:     Breath sounds: Examination of the right-lower field reveals decreased breath sounds. Examination of the left-lower field reveals decreased breath sounds. Decreased breath sounds present. No wheezing, rhonchi or rales.  Abdominal:     Palpations: Abdomen is soft.     Tenderness: There  is no abdominal tenderness.  Musculoskeletal:     Right lower leg: Swelling present.     Left lower leg: Swelling present.  Skin:    General: Skin is warm.     Findings: No rash.  Neurological:     Mental Status: She is alert and oriented to person, place, and time.      Condition at discharge: stable  The results of significant diagnostics from this hospitalization (including imaging, microbiology, ancillary and laboratory) are listed below for reference.   Imaging Studies: CT HEAD WO CONTRAST ( ) Result Date: 08/10/2024 CLINICAL DATA:  Vision loss EXAM: CT HEAD WITHOUT CONTRAST TECHNIQUE: Contiguous axial images were obtained from the base of the skull through the vertex without intravenous contrast. RADIATION DOSE REDUCTION: This exam was performed according to the departmental dose-optimization program which includes automated exposure control, adjustment of the mA and/or kV according to patient size and/or use of iterative reconstruction technique.  COMPARISON:  MRI 06/30/2021, head CT 06/29/2021 FINDINGS: Brain: No acute territorial infarction, hemorrhage or intracranial mass. The ventricles are nonenlarged Vascular: No hyperdense vessel or unexpected calcification. Skull: Normal. Negative for fracture or focal lesion. Sinuses/Orbits: No acute finding. Other: None IMPRESSION: Negative non contrasted CT appearance of the brain. Electronically Signed   By: Luke Bun M.D.   On: 08/10/2024 20:28   DG Chest 2 View Result Date: 08/10/2024 CLINICAL DATA:  Dyspnea EXAM: DG CHEST 2V COMPARISON:  May 24, 2024, July 26, 2024 FINDINGS: In the posterior lung base on the lateral radiograph, there is a 2.6 cm nodular opacity, which is without correlate on the frontal radiograph. No pleural effusion or pneumothorax. No focal airspace consolidation, pleural effusion, or pneumothorax. No cardiomegaly.No acute fracture or destructive lesion. Multilevel thoracic osteophytosis. IMPRESSION: In the posterior lung base on the lateral radiograph, there is a 2.6 cm nodular opacity, which is without correlate on the frontal radiograph. As there was no corresponding lesion on the prior MRI, this could represent a region of developing bronchopneumonia, in the correct clinical context. A 2 view chest radiograph in 6-12 weeks is recommended to document resolution once treatment is complete. Electronically Signed   By: Rogelia Myers M.D.   On: 08/10/2024 16:25   MR ANGIO CHEST W WO CONTRAST Result Date: 07/27/2024 CLINICAL DATA:  Clinical concern for giant cell arteritis EXAM: MRA CHEST WITH OR WITHOUT CONTRAST TECHNIQUE: Angiographic images of the chest were obtained using MRA technique without and with intravenous contrast. CONTRAST:  7mL GADAVIST  GADOBUTROL  1 MMOL/ML IV SOLN COMPARISON:  Temporal artery ultrasound performed July 04, 2024 CT of the chest performed October 14, 2021 FINDINGS: VASCULAR Aorta: The tubular ascending thoracic aorta is within normal limits for  diameter estimated approximately 3.0 cm. Two vessel aortic arch. The proximal arch vessels are ectatic, but patent. The descending thoracic aorta is patent. Of note, the descending thoracic aorta demonstrates subtle diffusely increased T2 signal in the descending thoracic and proximal abdominal aortic segments. Heart: Mildly enlarged heart.  No pericardial effusion. Pulmonary Arteries:  Within normal limits. Other: The imaged portion of the abdominal aorta is without aneurysm. The celiac and SMA are patent. The proximal right and left renal arteries are patent. NON-VASCULAR Spinal cord: Nothing significant. Brachial plexus: Nothing significant. Muscles and tendons: Nothing significant. Bones: Nothing significant. Joints: Nothing significant. IMPRESSION: 1. There is no evidence of significant vascular stenosis. The proximal arch vessels are patent. 2. There is subtle increased T2 signal in the descending thoracic aorta and proximal abdominal aorta. 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 Electronically Signed   By: Maude Naegeli M.D.   On: 07/27/2024 12:25    Microbiology: Results for orders placed or performed during the hospital encounter of 03/15/23  SARS Coronavirus 2 by RT PCR (hospital order, performed in [facility] hospital lab) *cepheid single result test* Anterior Nasal Swab     Status: Abnormal   Collection Time: 03/15/23  8:04 PM   Specimen: Anterior Nasal Swab  Result Value Ref Range Status   SARS Coronavirus 2 by RT PCR POSITIVE (A) NEGATIVE Final    Comment: (NOTE) SARS-CoV-2 target nucleic acids are DETECTED  SARS-CoV-2 RNA is generally detectable in upper respiratory specimens  during the acute phase of infection.  Positive results are indicative  of the presence of the identified virus, but do not rule out bacterial infection or co-infection with other pathogens not detected by the test.   Clinical correlation with patient history and  other diagnostic information is necessary to determine patient infection status.  The expected result is negative.  Fact Sheet for Patients:   RoadLapTop.co.za   Fact Sheet for Healthcare Providers:   http://kim-miller.com/    This test is not yet approved or cleared by the [place]  FDA and  has been authorized for detection and/or diagnosis of SARS-CoV-2 by FDA under an Emergency Use Authorization (EUA).  This EUA will remain in effect (meaning this test can be used) for the duration of  the COVID-19 declaration under Section 564(b)(1)  of the Act, 21 U.S.C. section 360-bbb-3(b)(1), unless the authorization is terminated or revoked sooner.   Performed at [facility], [address] [place], [place] [address]     Labs: CBC: Recent Labs  Lab 08/10/24 1544 08/11/24 0418 08/14/24 0334  WBC 6.3 7.1 11.8*  HGB 12.9 11.9* 11.4*  HCT 38.8 36.2 34.3*  MCV 81.9 82.1 81.3  PLT 275 273 268   Basic Metabolic Panel: Recent Labs  Lab 08/10/24 1544 08/11/24 0418 08/14/24 0334  NA 140 140 143  K 3.9 3.6 3.1*  CL 104 104 107  CO2 20* 23 26  GLUCOSE 235* 302* 215*  BUN 15 21* 36*  CREATININE 0.68 0.87 1.09*  CALCIUM  9.8 9.3 8.8*   Liver Function Tests: No results for input(s): AST, ALT, ALKPHOS, BILITOT, PROT, ALBUMIN in the last 168 hours. CBG: Recent Labs  Lab 08/13/24 0821 08/13/24 1218 08/13/24 1625 08/13/24 2044 08/14/24 0743  GLUCAP 250* 264* 188* 197* 186*    Discharge time spent: greater than 30 minutes.  Signed: Charlie Patterson, MD [facility] 08/14/2024
